# Patient Record
Sex: Female | Born: 1965 | Race: White | Hispanic: No | Marital: Married | Smoking: Never smoker
Health system: Southern US, Community
[De-identification: ages and names within clinical notes are randomized; demographics above are authoritative.]

## PROBLEM LIST (undated history)

## (undated) DIAGNOSIS — D649 Anemia, unspecified: Secondary | ICD-10-CM

## (undated) DIAGNOSIS — G43019 Migraine without aura, intractable, without status migrainosus: Secondary | ICD-10-CM

## (undated) HISTORY — DX: Migraine without aura, intractable, without status migrainosus: G43.019

## (undated) HISTORY — PX: BOTOX INJECTION: SHX5754

---

## 2001-11-17 ENCOUNTER — Other Ambulatory Visit: Admission: RE | Admit: 2001-11-17 | Discharge: 2001-11-17 | Payer: Self-pay | Admitting: Obstetrics and Gynecology

## 2001-11-21 ENCOUNTER — Encounter: Admission: RE | Admit: 2001-11-21 | Discharge: 2001-11-21 | Payer: Self-pay | Admitting: Obstetrics and Gynecology

## 2001-11-21 ENCOUNTER — Encounter: Payer: Self-pay | Admitting: Obstetrics and Gynecology

## 2005-09-29 ENCOUNTER — Ambulatory Visit: Payer: Self-pay | Admitting: Family Medicine

## 2005-10-20 ENCOUNTER — Ambulatory Visit: Payer: Self-pay | Admitting: Family Medicine

## 2005-12-22 ENCOUNTER — Ambulatory Visit: Payer: Self-pay | Admitting: Family Medicine

## 2006-04-20 ENCOUNTER — Ambulatory Visit: Payer: Self-pay | Admitting: Family Medicine

## 2011-02-02 ENCOUNTER — Emergency Department: Payer: Self-pay | Admitting: Emergency Medicine

## 2012-04-22 ENCOUNTER — Emergency Department: Payer: Self-pay | Admitting: Emergency Medicine

## 2012-05-03 ENCOUNTER — Ambulatory Visit: Payer: Self-pay | Admitting: Family Medicine

## 2012-05-06 HISTORY — PX: CERVICAL FUSION: SHX112

## 2012-07-11 ENCOUNTER — Other Ambulatory Visit: Payer: Self-pay | Admitting: Neurology

## 2012-09-12 ENCOUNTER — Telehealth: Payer: Self-pay | Admitting: Neurology

## 2012-09-12 ENCOUNTER — Ambulatory Visit: Payer: Self-pay | Admitting: Neurology

## 2012-09-12 NOTE — Telephone Encounter (Signed)
Mindy Jenkins please call patient and schedule appointment with Dr.Willis. Patient does not want Botox anymore and has not seen Willis since 2011.

## 2012-09-13 ENCOUNTER — Other Ambulatory Visit: Payer: Self-pay | Admitting: Neurology

## 2012-09-15 ENCOUNTER — Encounter: Payer: Self-pay | Admitting: Neurology

## 2012-09-15 ENCOUNTER — Ambulatory Visit (INDEPENDENT_AMBULATORY_CARE_PROVIDER_SITE_OTHER): Payer: Managed Care, Other (non HMO) | Admitting: Neurology

## 2012-09-15 VITALS — BP 138/85 | HR 102 | Wt 188.0 lb

## 2012-09-15 DIAGNOSIS — Z981 Arthrodesis status: Secondary | ICD-10-CM | POA: Insufficient documentation

## 2012-09-15 DIAGNOSIS — G43019 Migraine without aura, intractable, without status migrainosus: Secondary | ICD-10-CM

## 2012-09-15 MED ORDER — ZONISAMIDE 50 MG PO CAPS: ORAL_CAPSULE | ORAL | Status: DC

## 2012-09-15 MED ORDER — HYDROCODONE-ACETAMINOPHEN 10-325 MG PO TABS: 1.0000 | ORAL_TABLET | Freq: Four times a day (QID) | ORAL | Status: DC | PRN

## 2012-09-15 NOTE — Progress Notes (Signed)
Reason for visit: Headache  Mindy Jenkins is an 47 y.o. female  History of present illness:  Mindy Jenkins is a 47 year old right-handed white female with a history of migraine headaches. The patient indicates that she is starting to have an irregular menstrual cycle, going into menopause. With this, her headaches have significantly worsened, particularly over the last 2 or 3 months. The patient has had 16 days of the month with headache over the last 4 weeks. The patient indicates that the headaches are mainly in the front of the head. In the past, the patient has been on a multitude of medications prophylactically for her headache including Topamax, which did help her headache, but caused cognitive side effects. The patient has been on calcium channel blockers, beta blockers, Depakote, and tricyclic antidepressant medications without benefit. The patient was given a trial on Botox, and this seemed to help initially, but the patient indicates that ultimately, the benefit was modest, and the cost of the treatment did not justify the benefit. The patient had a lot of nausea with her headaches. The headaches may last up to 3 days at a time. The patient takes Maxalt and occasionally Imitrex injections for the headache. The patient has hydrocodone and Phenergan. The patient returns for an evaluation.  Past Medical History  Diagnosis Date  . Migraine without aura, with intractable migraine, so stated, without mention of status migrainosus     Past Surgical History  Procedure Laterality Date  . Back surgery  2014    Family History  Problem Relation Age of Onset  . Heart attack Mother   . Cervical cancer Mother   . Epilepsy Child     Rolandic    Social history:  reports that she has never smoked. She has never used smokeless tobacco. She reports that she does not drink alcohol or use illicit drugs.   No Known Allergies  Medications:  Current Outpatient Prescriptions on File Prior  to Visit  Medication Sig Dispense Refill  . MAGNESIUM-OXIDE 400 (241.3 MG) MG tablet TAKE 1 TABLET BY MOUTH EVERY DAY  90 tablet  0  . PROMETHEGAN 50 MG suppository INSERT 1 SUPPOSITORY RECTALLY EVERY 6 HOURS AS NEEDED  12 suppository  0  . rizatriptan (MAXALT) 10 MG tablet TAKE 1 TABLET BY MOUTH THREE TIMES DAILY AS NEEDED. MAXIMUM OF 3 TABLETS PER 24 HOURS  12 tablet  0   No current facility-administered medications on file prior to visit.    ROS:  Out of a complete 14 system review of symptoms, the patient complains only of the following symptoms, and all other reviewed systems are negative.  Headache Depression Insomnia, decreased energy  Blood pressure 138/85, pulse 102, weight 188 lb (85.276 kg).  Physical Exam  General: The patient is alert and cooperative at the time of the examination. The patient is minimally obese.  Skin: No significant peripheral edema is noted.   Neurologic Exam  Cranial nerves: Facial symmetry is present. Speech is normal, no aphasia or dysarthria is noted. Extraocular movements are full. Visual fields are full.  Motor: The patient has good strength in all 4 extremities.  Coordination: The patient has good finger-nose-finger and heel-to-shin bilaterally.  Gait and station: The patient has a normal gait. Tandem gait is normal. Romberg is negative. No drift is seen.  Reflexes: Deep tendon reflexes are symmetric.   Assessment/Plan:  One. Intractable migraine  The patient will be placed on Zonegran to see if this helps. If the patient gets no  benefit with the medication, or the medication cannot be tolerated, we may try long-acting topiramate, Trokendi. The patient will be continued on the Maxalt and Imitrex, and she will followup in 4 months.  Marlan Palau MD 09/15/2012 1:54 PM  [facility] [address] [place], [place] [address]  Phone [phone] Fax [phone]

## 2012-10-11 ENCOUNTER — Other Ambulatory Visit: Payer: Self-pay

## 2012-10-11 MED ORDER — HYDROCODONE-ACETAMINOPHEN 10-325 MG PO TABS: 1.0000 | ORAL_TABLET | Freq: Four times a day (QID) | ORAL | Status: DC | PRN

## 2012-10-11 NOTE — Telephone Encounter (Signed)
Patient called requesting a refill on Hydrocodone.  She would like the Rx mailed to her when it's ready.   

## 2012-11-16 ENCOUNTER — Telehealth: Payer: Self-pay | Admitting: Neurology

## 2012-11-16 ENCOUNTER — Other Ambulatory Visit: Payer: Self-pay

## 2012-11-16 MED ORDER — HYDROCODONE-ACETAMINOPHEN 10-325 MG PO TABS: 1.0000 | ORAL_TABLET | Freq: Four times a day (QID) | ORAL | Status: DC | PRN

## 2012-11-16 NOTE — Telephone Encounter (Signed)
Called patient about her prescription being ready to be mail, patient stated that she would like for her prescription to be left at the front desk for her to pick up, because she is going out of town. I stated to the patient that i would do that and if she has any questions or concerns to call us back.

## 2012-12-13 ENCOUNTER — Other Ambulatory Visit: Payer: Self-pay

## 2012-12-13 MED ORDER — ZONISAMIDE 50 MG PO CAPS: 100.0000 mg | ORAL_CAPSULE | Freq: Two times a day (BID) | ORAL | Status: DC

## 2012-12-13 MED ORDER — HYDROCODONE-ACETAMINOPHEN 10-325 MG PO TABS: 1.0000 | ORAL_TABLET | Freq: Four times a day (QID) | ORAL | Status: DC | PRN

## 2012-12-13 NOTE — Telephone Encounter (Signed)
Patient called requesting a refill on Zonegran for 50mg  two BID (since she has titrated up to this dose) and also wants a refill on Hydrocodone.  She would like to pick up the pain Rx when it's ready.  Call back number [phone].  Zonegran will be sent directly to her pharmacy.

## 2012-12-13 NOTE — Telephone Encounter (Signed)
I called patient to pick up Rx. For hydrocodone

## 2013-01-17 ENCOUNTER — Other Ambulatory Visit: Payer: Self-pay

## 2013-01-17 ENCOUNTER — Telehealth: Payer: Self-pay

## 2013-01-17 MED ORDER — HYDROCODONE-ACETAMINOPHEN 10-325 MG PO TABS: 1.0000 | ORAL_TABLET | Freq: Four times a day (QID) | ORAL | Status: DC | PRN

## 2013-01-17 MED ORDER — SUMATRIPTAN SUCCINATE 6 MG/0.5ML ~~LOC~~ SOLN: 6.0000 mg | Freq: Two times a day (BID) | SUBCUTANEOUS | Status: DC | PRN

## 2013-01-17 NOTE — Telephone Encounter (Signed)
I called patient about picking up her Rx (See Rx note). She states that she has had a migraine for five days and wants to know if she can get an injection. She states that otherwise, she will have to go to the ER. I let her know I will have to ask and will get back to her. I asked D. McCaw, RN if there was time. She stated that it would be okay if she could get here no later than 4 p.m. And it was okay with MD. I spoke with Dr Anne HahnWillis. He said that would be fine to give her 1 gm depacon.  I called patient back and left her a VM to call back. We can do this but she has to be here no later than 4 p.m.

## 2013-01-17 NOTE — Telephone Encounter (Signed)
Patient called requesting a refill on Norco.  She would like a call back at [phone] when the Rx is ready.

## 2013-01-17 NOTE — Telephone Encounter (Signed)
Patients father is coming from [place] and will not be here in time to bring her. Patient is requesting that we refill her imitrex instead of her coming in. I will forward information to Dr. Anne HahnWillis and Linton RumpJ. Banks, RPhT

## 2013-01-17 NOTE — Telephone Encounter (Signed)
Called patient. Rx ready for pick up. Her dad will pick up.

## 2013-01-17 NOTE — Telephone Encounter (Signed)
I have sent the Rx.  I called the patient.  She is aware.

## 2013-02-08 ENCOUNTER — Ambulatory Visit: Payer: Managed Care, Other (non HMO) | Admitting: Neurology

## 2013-03-01 ENCOUNTER — Other Ambulatory Visit: Payer: Self-pay | Admitting: *Deleted

## 2013-03-01 MED ORDER — HYDROCODONE-ACETAMINOPHEN 10-325 MG PO TABS: 1.0000 | ORAL_TABLET | Freq: Four times a day (QID) | ORAL | Status: DC | PRN

## 2013-03-01 MED ORDER — PROMETHAZINE HCL 50 MG RE SUPP: 50.0000 mg | Freq: Four times a day (QID) | RECTAL | Status: DC | PRN

## 2013-03-01 NOTE — Telephone Encounter (Signed)
Patient is requesting written Rx's.

## 2013-03-03 NOTE — Telephone Encounter (Signed)
Called patient and left message informing her that her Rx was ready to be picked up at the front desk and if she has any other problems, questions or concerns to call the office. °

## 2013-04-07 ENCOUNTER — Other Ambulatory Visit: Payer: Self-pay | Admitting: Neurology

## 2013-04-07 MED ORDER — HYDROCODONE-ACETAMINOPHEN 10-325 MG PO TABS: 1.0000 | ORAL_TABLET | Freq: Four times a day (QID) | ORAL | Status: DC | PRN

## 2013-04-07 NOTE — Telephone Encounter (Signed)
Patient requesting paper script refill of Norco. Please call and advise patient.

## 2013-04-07 NOTE — Telephone Encounter (Signed)
Called pt and left message informing her that her Rx was ready to be picked up at the front desk and if she has any other problems, questions or concerns to call the office.  °

## 2013-05-16 ENCOUNTER — Other Ambulatory Visit: Payer: Self-pay | Admitting: Neurology

## 2013-05-16 MED ORDER — HYDROCODONE-ACETAMINOPHEN 10-325 MG PO TABS: 1.0000 | ORAL_TABLET | Freq: Four times a day (QID) | ORAL | Status: DC | PRN

## 2013-05-16 NOTE — Telephone Encounter (Signed)
Request forwarded to provider for approval  

## 2013-05-16 NOTE — Telephone Encounter (Signed)
Pt called to request written refill on HYDROcodone-acetaminophen (NORCO) 10-325 MG per tablet, please call pt back when ready for pick up. Thanks

## 2013-05-17 NOTE — Telephone Encounter (Signed)
Called pt and left message informing her that her Rx was ready to be picked up at the front desk and if she has any other problems, questions or concerns to call the office.  °

## 2013-05-23 ENCOUNTER — Encounter (INDEPENDENT_AMBULATORY_CARE_PROVIDER_SITE_OTHER): Payer: Self-pay

## 2013-05-23 ENCOUNTER — Encounter: Payer: Self-pay | Admitting: Neurology

## 2013-05-23 ENCOUNTER — Ambulatory Visit (INDEPENDENT_AMBULATORY_CARE_PROVIDER_SITE_OTHER): Payer: Managed Care, Other (non HMO) | Admitting: Neurology

## 2013-05-23 VITALS — BP 132/81 | HR 91 | Wt 170.0 lb

## 2013-05-23 DIAGNOSIS — G43019 Migraine without aura, intractable, without status migrainosus: Secondary | ICD-10-CM

## 2013-05-23 MED ORDER — PROMETHAZINE HCL 50 MG PO TABS
50.0000 mg | ORAL_TABLET | Freq: Four times a day (QID) | ORAL | Status: DC | PRN
Start: 2013-05-23 — End: 2016-03-20

## 2013-05-23 MED ORDER — TOPIRAMATE ER 25 MG PO CAP24: ORAL_CAPSULE | ORAL | Status: DC

## 2013-05-23 NOTE — Progress Notes (Signed)
Reason for visit: Migraine  Mindy Jenkins is an 48 y.o. female  History of present illness:  Mindy Jenkins is a 48 year old right-handed white female with a history of intractable migraine headache. She indicates that she currently is getting 4 to 5 headaches a week. She indicates that she is having some hormonal instability associated with early menopause. She also indicates that she has been on her some increased stress as her husband has recently been diagnosed with multiple sclerosis. She has undergone multiple therapies for her migraine without definite benefit including Botox injections. Topamax seemed to helped her headaches previously, but she had significant cognitive side effects on the medication. The patient indicates that she got no benefit from Zonegran, and she discontinued this medication. She returns to this office for further evaluation.  Past Medical History  Diagnosis Date  . Migraine without aura, with intractable migraine, so stated, without mention of status migrainosus     Past Surgical History  Procedure Laterality Date  . Back surgery  2014    Family History  Problem Relation Age of Onset  . Heart attack Mother   . Cervical cancer Mother   . Epilepsy Child     Rolandic    Social history:  reports that she has never smoked. She has never used smokeless tobacco. She reports that she does not drink alcohol or use illicit drugs.   No Known Allergies  Medications:  Current Outpatient Prescriptions on File Prior to Visit  Medication Sig Dispense Refill  . HYDROcodone-acetaminophen (NORCO) 10-325 MG per tablet Take 1 tablet by mouth every 6 (six) hours as needed (Must last 28 days).  40 tablet  0  . MAGNESIUM-OXIDE 400 (241.3 MG) MG tablet TAKE 1 TABLET BY MOUTH EVERY DAY  90 tablet  0  . POTASSIUM GLUCONATE PO Take 1 tablet by mouth daily.      . promethazine (PROMETHEGAN) 50 MG suppository Place 1 suppository (50 mg total) rectally every 6 (six)  hours as needed for vomiting.  12 suppository  1  . rizatriptan (MAXALT) 10 MG tablet TAKE 1 TABLET BY MOUTH THREE TIMES DAILY AS NEEDED. MAXIMUM OF 3 TABLETS PER 24 HOURS  12 tablet  0  . SUMAtriptan (IMITREX) 6 MG/0.5ML SOLN injection Inject 0.5 mLs (6 mg total) into the skin 2 (two) times daily as needed for migraine or headache. F  6 vial  3   No current facility-administered medications on file prior to visit.    ROS:  Out of a complete 14 system review of symptoms, the patient complains only of the following symptoms, and all other reviewed systems are negative.  Frequent waking Headache  Blood pressure 132/81, pulse 91, weight 170 lb (77.111 kg).  Physical Exam  General: The patient is alert and cooperative at the time of the examination. The patient is minimally obese.  Skin: No significant peripheral edema is noted.   Neurologic Exam  Mental status: The patient is oriented x 3.  Cranial nerves: Facial symmetry is present. Speech is normal, no aphasia or dysarthria is noted. Extraocular movements are full. Visual fields are full.  Motor: The patient has good strength in all 4 extremities.  Sensory examination: Soft touch sensation is symmetric on the face, arms, and legs.  Coordination: The patient has good finger-nose-finger and heel-to-shin bilaterally.  Gait and station: The patient has a normal gait. Tandem gait is normal. Romberg is negative. No drift is seen.  Reflexes: Deep tendon reflexes are symmetric.  Assessment/Plan:  One. Migraine headache  The patient will be switched back to Trokendi, and we will start in low dose. The patient is to contact our office if she is tolerating it, but needs a higher dose. A prescription was also given for the Phenergan tablets. The patient will occasionally take Phenergan suppositories as well. She has Imitrex injections to take and Maxalt tablets. She will followup in 6 months.  Marlan Palau. Keith Willis MD 05/23/2013 9:42  PM  [facility] [address] [place], [place] [address]  Phone [phone] Fax [phone]

## 2013-05-23 NOTE — Patient Instructions (Signed)
Migraine Headache A migraine headache is an intense, throbbing pain on one or both sides of your head. A migraine can last for 30 minutes to several hours. CAUSES  The exact cause of a migraine headache is not always known. However, a migraine may be caused when nerves in the brain become irritated and release chemicals that cause inflammation. This causes pain. Certain things may also trigger migraines, such as:  Alcohol.  Smoking.  Stress.  Menstruation.  Aged cheeses.  Foods or drinks that contain nitrates, glutamate, aspartame, or tyramine.  Lack of sleep.  Chocolate.  Caffeine.  Hunger.  Physical exertion.  Fatigue.  Medicines used to treat chest pain (nitroglycerine), birth control pills, estrogen, and some blood pressure medicines. SIGNS AND SYMPTOMS  Pain on one or both sides of your head.  Pulsating or throbbing pain.  Severe pain that prevents daily activities.  Pain that is aggravated by any physical activity.  Nausea, vomiting, or both.  Dizziness.  Pain with exposure to bright lights, loud noises, or activity.  General sensitivity to bright lights, loud noises, or smells. Before you get a migraine, you may get warning signs that a migraine is coming (aura). An aura may include:  Seeing flashing lights.  Seeing bright spots, halos, or zig-zag lines.  Having tunnel vision or blurred vision.  Having feelings of numbness or tingling.  Having trouble talking.  Having muscle weakness. DIAGNOSIS  A migraine headache is often diagnosed based on:  Symptoms.  Physical exam.  A CT scan or MRI of your head. These imaging tests cannot diagnose migraines, but they can help rule out other causes of headaches. TREATMENT Medicines may be given for pain and nausea. Medicines can also be given to help prevent recurrent migraines.  HOME CARE INSTRUCTIONS  Only take over-the-counter or prescription medicines for pain or discomfort as directed by your  health care provider. The use of long-term narcotics is not recommended.  Lie down in a dark, quiet room when you have a migraine.  Keep a journal to find out what may trigger your migraine headaches. For example, write down:  What you eat and drink.  How much sleep you get.  Any change to your diet or medicines.  Limit alcohol consumption.  Quit smoking if you smoke.  Get 7 9 hours of sleep, or as recommended by your health care provider.  Limit stress.  Keep lights dim if bright lights bother you and make your migraines worse. SEEK IMMEDIATE MEDICAL CARE IF:   Your migraine becomes severe.  You have a fever.  You have a stiff neck.  You have vision loss.  You have muscular weakness or loss of muscle control.  You start losing your balance or have trouble walking.  You feel faint or pass out.  You have severe symptoms that are different from your first symptoms. MAKE SURE YOU:   Understand these instructions.  Will watch your condition.  Will get help right away if you are not doing well or get worse. Document Released: 12/22/2004 Document Revised: 10/12/2012 Document Reviewed: 08/29/2012 [employer] Patient Information 2014 [employer].  

## 2013-06-20 ENCOUNTER — Other Ambulatory Visit: Payer: Self-pay | Admitting: Neurology

## 2013-06-20 MED ORDER — HYDROCODONE-ACETAMINOPHEN 10-325 MG PO TABS: 1.0000 | ORAL_TABLET | Freq: Four times a day (QID) | ORAL | Status: DC | PRN

## 2013-06-20 NOTE — Telephone Encounter (Signed)
Request forwarded to provider for approval  

## 2013-06-20 NOTE — Telephone Encounter (Signed)
Patient requesting paper script refill of Norco to pick up.

## 2013-06-20 NOTE — Telephone Encounter (Signed)
Called pt to inform her that her Rx was ready to be picked up at the front desk and if she has any other problems, questions or concerns to call the office. Pt verbalized understanding. °

## 2013-07-25 ENCOUNTER — Ambulatory Visit: Payer: Self-pay | Admitting: Family Medicine

## 2013-07-26 ENCOUNTER — Other Ambulatory Visit: Payer: Self-pay | Admitting: Neurology

## 2013-07-26 MED ORDER — HYDROCODONE-ACETAMINOPHEN 10-325 MG PO TABS: 1.0000 | ORAL_TABLET | Freq: Four times a day (QID) | ORAL | Status: DC | PRN

## 2013-07-26 NOTE — Telephone Encounter (Signed)
Patient requesting Norco 10/325 script refill to pick up, states that she is having Perlie GoldRussell come and pick it up for her.

## 2013-07-26 NOTE — Telephone Encounter (Signed)
Request forwarded to provider for approval  

## 2013-07-26 NOTE — Telephone Encounter (Signed)
Called pt and spoke with pt's husband Perlie GoldRussell informing him that the pt's Rx was ready to be picked up at the front desk and if the pt has any other problems, questions or concerns to call the office. Husband verbalized understanding.

## 2013-08-28 ENCOUNTER — Telehealth: Payer: Self-pay | Admitting: *Deleted

## 2013-08-28 MED ORDER — TOPIRAMATE ER 25 MG PO CAP24
ORAL_CAPSULE | ORAL | Status: DC
Start: 2013-08-28 — End: 2013-11-28

## 2013-08-28 MED ORDER — HYDROCODONE-ACETAMINOPHEN 10-325 MG PO TABS: 1.0000 | ORAL_TABLET | Freq: Four times a day (QID) | ORAL | Status: DC | PRN

## 2013-08-28 NOTE — Telephone Encounter (Signed)
Please refill Norco 10-325. Patient would also like to discuss restarting botox.

## 2013-08-28 NOTE — Telephone Encounter (Signed)
Refill request forwarded to provider for approval.

## 2013-08-28 NOTE — Telephone Encounter (Signed)
I called patient. The patient will get a refill of her Norco, and she wants to retry the Botox injections. I'll try to get this set up. We will increase the Trokendi taking 25 mg in the morning, 50 mg in the evening.

## 2013-08-29 ENCOUNTER — Telehealth: Payer: Self-pay | Admitting: *Deleted

## 2013-08-29 NOTE — Telephone Encounter (Signed)
Patient needs to come by office to sign consent for botox.

## 2013-08-29 NOTE — Telephone Encounter (Signed)
Called pt to inform her that her Rx was ready to be picked up at the front desk and if she has any other problems, questions or concerns to call the office. Pt verbalized understanding. °

## 2013-09-27 ENCOUNTER — Telehealth: Payer: Self-pay | Admitting: Neurology

## 2013-09-27 NOTE — Telephone Encounter (Signed)
Patient requesting refill of generic of norco, please call when ready for pick up.

## 2013-09-29 MED ORDER — HYDROCODONE-ACETAMINOPHEN 10-325 MG PO TABS: 1.0000 | ORAL_TABLET | Freq: Four times a day (QID) | ORAL | Status: DC | PRN

## 2013-09-29 NOTE — Telephone Encounter (Signed)
Please advise previous note. Thanks  °

## 2013-09-29 NOTE — Telephone Encounter (Signed)
I will refill the hydrocodone. 

## 2013-10-03 NOTE — Telephone Encounter (Signed)
Called pts cell #.  LMVM for her that prescription ready for pick up.  (placed up front).

## 2013-10-31 ENCOUNTER — Other Ambulatory Visit: Payer: Self-pay | Admitting: Neurology

## 2013-10-31 MED ORDER — HYDROCODONE-ACETAMINOPHEN 10-325 MG PO TABS: 1.0000 | ORAL_TABLET | Freq: Four times a day (QID) | ORAL | Status: DC | PRN

## 2013-10-31 NOTE — Telephone Encounter (Signed)
Patient calling for Rx refill of HYDROcodone-acetaminophen (NORCO) 10-325 MG per tablet.  Please call when ready for pick up.

## 2013-10-31 NOTE — Telephone Encounter (Signed)
Request entered, forwarded to provider for approval.  

## 2013-10-31 NOTE — Telephone Encounter (Signed)
I called the patient and left a message to let them know their Rx for Hydrocodone was ready for pickup. Patient was instructed to bring Photo ID.

## 2013-11-28 ENCOUNTER — Encounter: Payer: Self-pay | Admitting: Adult Health

## 2013-11-28 ENCOUNTER — Ambulatory Visit (INDEPENDENT_AMBULATORY_CARE_PROVIDER_SITE_OTHER): Payer: Managed Care, Other (non HMO) | Admitting: Adult Health

## 2013-11-28 VITALS — BP 126/76 | HR 74 | Temp 97.4°F | Ht 65.0 in | Wt 182.0 lb

## 2013-11-28 DIAGNOSIS — G43019 Migraine without aura, intractable, without status migrainosus: Secondary | ICD-10-CM

## 2013-11-28 MED ORDER — RIZATRIPTAN BENZOATE 10 MG PO TABS: ORAL_TABLET | ORAL | Status: DC

## 2013-11-28 MED ORDER — HYDROCODONE-ACETAMINOPHEN 10-325 MG PO TABS: 1.0000 | ORAL_TABLET | Freq: Four times a day (QID) | ORAL | Status: DC | PRN

## 2013-11-28 MED ORDER — MAGNESIUM OXIDE -MG SUPPLEMENT 200 MG PO TABS: 200.0000 mg | ORAL_TABLET | Freq: Every day | ORAL | Status: DC

## 2013-11-28 NOTE — Patient Instructions (Signed)

## 2013-11-28 NOTE — Progress Notes (Signed)
I have read the note, and I agree with the clinical assessment and plan.  Kaaren Nass KEITH   

## 2013-11-28 NOTE — Progress Notes (Signed)
PATIENT: Mindy Jenkins DOB: 08-12-1965  REASON FOR VISIT: follow up HISTORY FROM: patient  HISTORY OF PRESENT ILLNESS: Ms. Mindy Jenkins is a 48 year old female with a history of intractable migraines. She returns today for follow-up. She was prescribed Trokendi 25 mg in the AM and 50 mg at PM but stopped it due to word finding issues. She states that she started working full time and can't be "cloudy" headed. She reports that her migraines have gotten worse. She has approximately 2 migraines a week. She also takes maxalt and imitrex injections if needed for the migraine. She also has Norco for severe discomfort. She is going to try botox injections again. She as an appointment scheduled for January 6th. The patient has tried multiple medications in the past without benefit. She believes that her hormones play a factor in her migraines as well as stress. The patient is currently under increased stress due to her husband being diagnosed with multiple sclerosis.  HISTORY 05/23/13 (WILLIS): Ms. Mindy Jenkins is a 48 year old right-handed white female with a history of intractable migraine headache. She indicates that she currently is getting 4 to 5 headaches a week. She indicates that she is having some hormonal instability associated with early menopause. She also indicates that she has been on her some increased stress as her husband has recently been diagnosed with multiple sclerosis. She has undergone multiple therapies for her migraine without definite benefit including Botox injections. Topamax seemed to helped her headaches previously, but she had significant cognitive side effects on the medication. The patient indicates that she got no benefit from Zonegran, and she discontinued this medication. She returns to this office for further evaluation.  REVIEW OF SYSTEMS: Out of a complete 14 system review of symptoms, the patient complains only of the following symptoms, and all other reviewed systems  are negative.  Headache Environmental allergies  ALLERGIES: No Known Allergies  HOME MEDICATIONS: Outpatient Prescriptions Prior to Visit  Medication Sig Dispense Refill  . buPROPion (WELLBUTRIN SR) 150 MG 12 hr tablet Take 150 mg by mouth daily.    Marland Kitchen. HYDROcodone-acetaminophen (NORCO) 10-325 MG per tablet Take 1 tablet by mouth every 6 (six) hours as needed (Must last 28 days). 40 tablet 0  . MAGNESIUM-OXIDE 400 (241.3 MG) MG tablet TAKE 1 TABLET BY MOUTH EVERY DAY 90 tablet 0  . NASONEX 50 MCG/ACT nasal spray Place 50 g into the nose daily.    Marland Kitchen. POTASSIUM GLUCONATE PO Take 1 tablet by mouth daily.    . promethazine (PHENERGAN) 50 MG tablet Take 1 tablet (50 mg total) by mouth every 6 (six) hours as needed for nausea or vomiting. 30 tablet 3  . promethazine (PROMETHEGAN) 50 MG suppository Place 1 suppository (50 mg total) rectally every 6 (six) hours as needed for vomiting. 12 suppository 1  . rizatriptan (MAXALT) 10 MG tablet TAKE 1 TABLET BY MOUTH THREE TIMES DAILY AS NEEDED. MAXIMUM OF 3 TABLETS PER 24 HOURS 12 tablet 0  . SUMAtriptan (IMITREX) 6 MG/0.5ML SOLN injection Inject 0.5 mLs (6 mg total) into the skin 2 (two) times daily as needed for migraine or headache. F 6 vial 3  . Topiramate ER 25 MG CP24 1 tablet in the morning, 2 tablets at night 90 capsule 3   No facility-administered medications prior to visit.    PAST MEDICAL HISTORY: Past Medical History  Diagnosis Date  . Migraine without aura, with intractable migraine, so stated, without mention of status migrainosus  PAST SURGICAL HISTORY: Past Surgical History  Procedure Laterality Date  . Back surgery  2014    FAMILY HISTORY: Family History  Problem Relation Age of Onset  . Heart attack Mother   . Cervical cancer Mother   . Epilepsy Child     Rolandic    SOCIAL HISTORY: History   Social History  . Marital Status: Married    Spouse Name: N/A    Number of Children: 2  . Years of Education: N/A    Occupational History  . self employed     Licensed conveyancersecret shopper   Social History Main Topics  . Smoking status: Never Smoker   . Smokeless tobacco: Never Used  . Alcohol Use: No  . Drug Use: No  . Sexual Activity: Not on file   Other Topics Concern  . Not on file   Social History Narrative      PHYSICAL EXAM  Filed Vitals:   11/28/13 1334  BP: 126/76  Pulse: 74  Temp: 97.4 F (36.3 C)  TempSrc: Oral  Height: 5\' 5"  (1.651 m)  Weight: 182 lb (82.555 kg)   Body mass index is 30.29 kg/(m^2).  Generalized: Well developed, in no acute distress   Neurological examination  Mentation: Alert oriented to time, place, history taking. Follows all commands speech and language fluent Cranial nerve II-XII: Pupils were equal round reactive to light. Extraocular movements were full, visual field were full on confrontational test. Facial sensation and strength were normal. Uvula tongue midline. Head turning and shoulder shrug  were normal and symmetric. Motor: The motor testing reveals 5 over 5 strength of all 4 extremities. Good symmetric motor tone is noted throughout.  Sensory: Sensory testing is intact to soft touch on all 4 extremities. No evidence of extinction is noted.  Coordination: Cerebellar testing reveals good finger-nose-finger and heel-to-shin bilaterally.  Gait and station: Gait is normal. Tandem gait is normal. Romberg is negative. No drift is seen.  Reflexes: Deep tendon reflexes are symmetric and normal bilaterally.   DIAGNOSTIC DATA (LABS, IMAGING, TESTING) - I reviewed patient records, labs, notes, testing and imaging myself where available.   ASSESSMENT AND PLAN 48 y.o. year old female  has a past medical history of Migraine without aura, with intractable migraine, so stated, without mention of status migrainosus. here with:  1. Migraines  The patient continues to have approximately 2 migraines a week. She has tried multiple medications in the past without  benefit. She is currently using Maxalt, Imitrex injections and Norco. I will refill these medications today. Since the patient is on narcotics, I had the patient sign a narcotic agreement. The patient will receive Botox injections January 6. At this time I will not start any new medications. I have advised patient that since she has noticed that her headaches are triggered by hormones she may benefit from birth control pills. I explained that sometimes this can make migraines worse but in some cases it does offer benefit. The patient states that she will speak with her OB/GYN regarding this. If the patient's symptoms worsen or she develops new symptoms she should let us know. Otherwise she will follow-up in 3-4 months.  Butch PennyMegan Raghad Lorenz, MSN, NP-C 11/28/2013, 1:42 PM [facility] [address], [address] [place], [place] [address] [phone]  Note: This document was prepared with digital dictation and possible smart phrase technology. Any transcriptional errors that result from this process are unintentional.

## 2013-12-25 ENCOUNTER — Other Ambulatory Visit: Payer: Self-pay | Admitting: Neurology

## 2013-12-25 NOTE — Telephone Encounter (Signed)
Dr Willis is out of the office.  Request entered, forwarded to WID for approval.  

## 2013-12-25 NOTE — Telephone Encounter (Signed)
Patient is calling to get a written Rx for generic Hydrocodone. Please call patient when ready for pickup. Thank you.

## 2013-12-26 MED ORDER — HYDROCODONE-ACETAMINOPHEN 10-325 MG PO TABS: 1.0000 | ORAL_TABLET | Freq: Four times a day (QID) | ORAL | Status: DC | PRN

## 2013-12-26 NOTE — Telephone Encounter (Signed)
Spoke to patient's husband that Norco ready for pick up.

## 2014-01-10 ENCOUNTER — Ambulatory Visit (INDEPENDENT_AMBULATORY_CARE_PROVIDER_SITE_OTHER): Payer: Managed Care, Other (non HMO) | Admitting: Neurology

## 2014-01-10 DIAGNOSIS — G43019 Migraine without aura, intractable, without status migrainosus: Secondary | ICD-10-CM

## 2014-01-10 DIAGNOSIS — G43719 Chronic migraine without aura, intractable, without status migrainosus: Secondary | ICD-10-CM

## 2014-01-10 MED ORDER — ONABOTULINUMTOXINA 100 UNITS IJ SOLR
200.0000 [IU] | Freq: Once | INTRAMUSCULAR | Status: AC
  Administered 2014-01-10: 200 [IU] via INTRAMUSCULAR

## 2014-01-10 MED ORDER — SUMATRIPTAN SUCCINATE 6.5 MG/4HR TD PTCH: 6.5000 | MEDICATED_PATCH | TRANSDERMAL | Status: DC | PRN

## 2014-01-10 NOTE — Progress Notes (Signed)
PATIENT: Mindy Jenkins DOB: [birth year]  HISTORY OF PRESENT ILLNESS: Mindy Jenkins is a 49 year old female with a history of intractable migraines.   I have tried BOTOX injection in the past, 2013,  without significant benefit,   In early 2015, her migraine was much improved, she has gone through menopause, since Summer 2015, she began to have frequent headaches ache.  She has about 2-3 times a week, her typical migraine are right frontal area severe pounding headaches with associated light noise sensitivity, nauseous, lasting for few hours, responding to Maxalt, and Imitrex subcutaneous injection as needed.  Previously she has tried Topamax, Trokendi complains side effect of cognitive slowing, word finding difficulties, it does help her headaches. She also tried nortriptyline, Inderal, many years ago, without having her migraine under control.  HISTORY: Mindy Jenkins is a 49 year old right-handed white female with a history of intractable migraine headache over past 16 years since 2000. She indicates that she currently is getting 4 to 5 headaches a week. She indicates that she is having some hormonal instability associated with early menopause. She also indicates that she has been on her some increased stress as her husband has recently been diagnosed with multiple sclerosis. She has undergone multiple therapies for her migraine without definite benefit including Botox injections in 2013. Topamax, Trokeni seemed to helped her headaches previously, but she had significant cognitive side effects on the medication. The patient indicates that she got no benefit from Zonegran, and she discontinued this medication. She returns to this office for further evaluation.  REVIEW OF SYSTEMS: Out of a complete 14 system review of symptoms, the patient complains only of the following symptoms, and all other reviewed systems are negative. Headache Environmental allergies  ALLERGIES: No Known  Allergies  HOME MEDICATIONS: Outpatient Prescriptions Prior to Visit  Medication Sig Dispense Refill  . HYDROcodone-acetaminophen (NORCO) 10-325 MG per tablet Take 1 tablet by mouth every 6 (six) hours as needed (Must last 28 days). 40 tablet 0  . Magnesium Oxide 200 MG TABS Take 1 tablet (200 mg total) by mouth daily. (Patient taking differently: Take 200 mg by mouth once a week. ) 30 tablet 6  . NASONEX 50 MCG/ACT nasal spray Place 50 g into the nose daily.    . promethazine (PHENERGAN) 50 MG tablet Take 1 tablet (50 mg total) by mouth every 6 (six) hours as needed for nausea or vomiting. 30 tablet 3  . promethazine (PROMETHEGAN) 50 MG suppository Place 1 suppository (50 mg total) rectally every 6 (six) hours as needed for vomiting. 12 suppository 1  . rizatriptan (MAXALT) 10 MG tablet TAKE 1 TABLET BY MOUTH THREE TIMES DAILY AS NEEDED. MAXIMUM OF 3 TABLETS PER 24 HOURS 12 tablet 3  . SUMAtriptan (IMITREX) 6 MG/0.5ML SOLN injection Inject 0.5 mLs (6 mg total) into the skin 2 (two) times daily as needed for migraine or headache. F 6 vial 3  . buPROPion (WELLBUTRIN SR) 150 MG 12 hr tablet Take 100 mg by mouth daily.     Marland Kitchen POTASSIUM GLUCONATE PO Take 1 tablet by mouth daily.     No facility-administered medications prior to visit.    PAST MEDICAL HISTORY: Past Medical History  Diagnosis Date  . Migraine without aura, with intractable migraine, so stated, without mention of status migrainosus     PAST SURGICAL HISTORY: Past Surgical History  Procedure Laterality Date  . Back surgery  2014    FAMILY HISTORY: Family History  Problem Relation Age of  Onset  . Heart attack Mother   . Cervical cancer Mother   . Epilepsy Child     Rolandic    SOCIAL HISTORY: History   Social History  . Marital Status: Married    Spouse Name: N/A    Number of Children: 2  . Years of Education: N/A   Occupational History  . self employed     Licensed conveyancersecret shopper   Social History Main Topics  .  Smoking status: Never Smoker   . Smokeless tobacco: Never Used  . Alcohol Use: No  . Drug Use: No  . Sexual Activity: Not on file   Other Topics Concern  . Not on file   Social History Narrative      PHYSICAL EXAM  Filed Vitals:   Cannot calculate BMI with a height equal to zero.  Generalized: Well developed, in no acute distress   Neurological examination  Mentation: Alert oriented to time, place, history taking. Follows all commands speech and language fluent Cranial nerve II-XII: Pupils were equal round reactive to light. Extraocular movements were full, visual field were full on confrontational test. Facial sensation and strength were normal. Uvula tongue midline. Head turning and shoulder shrug  were normal and symmetric. Motor: The motor testing reveals 5 over 5 strength of all 4 extremities. Good symmetric motor tone is noted throughout.  Sensory: Sensory testing is intact to soft touch on all 4 extremities. No evidence of extinction is noted.  Coordination: Cerebellar testing reveals good finger-nose-finger and heel-to-shin bilaterally.  Gait and station: Gait is normal. Tandem gait is normal. Romberg is negative. No drift is seen.  Reflexes: Deep tendon reflexes are symmetric and normal bilaterally.   DIAGNOSTIC DATA (LABS, IMAGING, TESTING) - I reviewed patient records, labs, notes, testing and imaging myself where available.   ASSESSMENT AND PLAN 49 years old Caucasian female, with history of intractable chronic migraine headaches, tried and failed multiple preventative medications in the past, came in for Botox injection as migraine prevention.  BOTOX injection was performed according to protocol by [employer]er[employer]. 100 units of BOTOX was dissolved into 2 cc NS.   Total of 155 units, discard 45 units.  Corrugator 2 sites, 10 units Procerus 1 site, 5 unit Frontalis 4 sites,  20 units, Temporalis 8 sites,  40 units  Occipitalis 6 sites, 30 units Cervical Paraspinal, 4  sites, 20 units Trapezius, 6 sites, 30 units  Patient tolerate the injection well. Will return for repeat injection in 3 months.   Levert FeinsteinYijun Ross Bender, M.D. Ph.D [facility] [address], [address] [place], [place] [id number] [phone]

## 2014-01-30 ENCOUNTER — Other Ambulatory Visit: Payer: Self-pay | Admitting: Neurology

## 2014-01-30 MED ORDER — HYDROCODONE-ACETAMINOPHEN 10-325 MG PO TABS: 1.0000 | ORAL_TABLET | Freq: Four times a day (QID) | ORAL | Status: DC | PRN

## 2014-01-30 NOTE — Telephone Encounter (Signed)
Request entered, forwarded to provider for approval.  

## 2014-01-30 NOTE — Telephone Encounter (Signed)
[facility]  Please call.

## 2014-01-31 ENCOUNTER — Telehealth: Payer: Self-pay

## 2014-01-31 NOTE — Telephone Encounter (Signed)
Called patient to inform Rx ready for pick up at front desk. No answer. Left vmail. 

## 2014-02-27 ENCOUNTER — Telehealth: Payer: Self-pay

## 2014-02-27 ENCOUNTER — Other Ambulatory Visit: Payer: Self-pay | Admitting: Neurology

## 2014-02-27 MED ORDER — MAGNESIUM OXIDE -MG SUPPLEMENT 200 MG PO TABS: 200.0000 mg | ORAL_TABLET | Freq: Every day | ORAL | Status: DC

## 2014-02-27 MED ORDER — HYDROCODONE-ACETAMINOPHEN 10-325 MG PO TABS: 1.0000 | ORAL_TABLET | Freq: Four times a day (QID) | ORAL | Status: DC | PRN

## 2014-02-27 NOTE — Telephone Encounter (Signed)
Called patient and informed Rx ready for pick up at front desk. Spouse verbalized understanding.

## 2014-02-27 NOTE — Telephone Encounter (Signed)
Request entered, forwarded to provider for approval.  

## 2014-02-27 NOTE — Telephone Encounter (Signed)
Pt is calling requesting a written for HYDROcodone-acetaminophen (NORCO) 10-325 MG per tablet.  She wants to pick this up by noon on Thursday 03/01/14 because she is going out of town.  She also needs refill on Magnesium Oxide 200 MG TABS [facility] to [facility] [address] [place].  Please call and advise.

## 2014-03-28 ENCOUNTER — Ambulatory Visit: Payer: Self-pay | Admitting: Family Medicine

## 2014-04-02 ENCOUNTER — Telehealth: Payer: Self-pay

## 2014-04-02 ENCOUNTER — Other Ambulatory Visit: Payer: Self-pay | Admitting: Neurology

## 2014-04-02 MED ORDER — HYDROCODONE-ACETAMINOPHEN 10-325 MG PO TABS: 1.0000 | ORAL_TABLET | Freq: Four times a day (QID) | ORAL | Status: DC | PRN

## 2014-04-02 NOTE — Telephone Encounter (Signed)
I spoke with patient and advised her that her Norco Rx was ready to pick up at front desk.

## 2014-04-02 NOTE — Telephone Encounter (Signed)
Request entered, forwarded to provider for approval.  

## 2014-04-02 NOTE — Telephone Encounter (Signed)
Pt is calling requesting a written Rx for HYDROcodone-acetaminophen (NORCO) 10-325 MG per tablet. Please call when ready for pick up. °

## 2014-04-04 ENCOUNTER — Ambulatory Visit: Payer: Managed Care, Other (non HMO) | Admitting: Adult Health

## 2014-04-11 ENCOUNTER — Telehealth: Payer: Self-pay | Admitting: Adult Health

## 2014-04-11 NOTE — Telephone Encounter (Signed)
Molli HazardMatthew with [facility] @ [phone], questioning if patient has any drug allergies.  Please call and advise.

## 2014-04-11 NOTE — Telephone Encounter (Signed)
Called [facility] back and spoke with CraigPhillip. Molli HazardMatthew was not available and they had all information they needed on patient.

## 2014-04-18 ENCOUNTER — Ambulatory Visit (INDEPENDENT_AMBULATORY_CARE_PROVIDER_SITE_OTHER): Payer: Managed Care, Other (non HMO) | Admitting: Neurology

## 2014-04-18 ENCOUNTER — Encounter: Payer: Self-pay | Admitting: Neurology

## 2014-04-18 ENCOUNTER — Encounter: Payer: Self-pay | Admitting: *Deleted

## 2014-04-18 VITALS — BP 126/85 | HR 76 | Ht 65.0 in | Wt 176.0 lb

## 2014-04-18 DIAGNOSIS — G43719 Chronic migraine without aura, intractable, without status migrainosus: Secondary | ICD-10-CM | POA: Diagnosis not present

## 2014-04-18 MED ORDER — ONABOTULINUMTOXINA 100 UNITS IJ SOLR: 200.0000 [IU] | Freq: Once | INTRAMUSCULAR | Status: DC

## 2014-04-18 MED ORDER — RIBOFLAVIN 100 MG PO CAPS: 100.0000 mg | ORAL_CAPSULE | Freq: Two times a day (BID) | ORAL | Status: DC

## 2014-04-18 MED ORDER — RIZATRIPTAN BENZOATE 10 MG PO TABS: ORAL_TABLET | ORAL | Status: DC

## 2014-04-18 NOTE — Progress Notes (Signed)
PATIENT: Mindy Jenkins DOB: [birth year]  HISTORY OF PRESENT ILLNESS: [name] is a 49 year old female with a history of intractable migraines.   I have tried BOTOX injection irregularly in the past, 2013,  without significant benefit,   In early 2015, her migraine was much improved, she has gone through menopause, since Summer 2015, she began to have frequent headaches   She has migraine about 2-3 times a week, her typical migraine are right frontal area severe pounding headaches with associated light noise sensitivity, nauseous, lasting for few hours, responding to Maxalt, and Imitrex subcutaneous injection as needed.  Previously she has tried Topamax, Trokendi complains side effect of cognitive slowing, word finding difficulties, it does help her headaches. No help with Zonegran She also tried nortriptyline, Inderal, many years ago, without having her migraine under control.  UPDATE April 18 2014: She had her injection in Jan 2016, no significant improvement, she still has migraine headaches 2-3 times each week,  She is taking imitrex, phenergan, narco, as needed, Maxalt seems to work better, I have advised her at magnesium oxide 400 mg twice a day, along with riboflavin 100 mg twice a day, she wants to retry Maxalt dissolvable, which seems to help her headaches better, I also advised her to take Excedrin Migraine, when necessary NSAIDs,  REVIEW OF SYSTEMS: Out of a complete 14 system review of symptoms, the patient complains only of the following symptoms, and all other reviewed systems are negative. Headache Environmental allergies  ALLERGIES: No Known Allergies  HOME MEDICATIONS: Outpatient Prescriptions Prior to Visit  Medication Sig Dispense Refill  . Botulinum Toxin Type A 200 UNITS SOLR Inject 200 Units as directed.    Marland Kitchen. buPROPion (WELLBUTRIN) 100 MG tablet Take 100 mg by mouth every evening.     Marland Kitchen. HYDROcodone-acetaminophen (NORCO) 10-325 MG per tablet Take 1  tablet by mouth every 6 (six) hours as needed (Must last 28 days). 40 tablet 0  . Magnesium Oxide 200 MG TABS Take 1 tablet (200 mg total) by mouth daily. 30 tablet 6  . NASONEX 50 MCG/ACT nasal spray Place 50 g into the nose daily.    . promethazine (PHENERGAN) 50 MG tablet Take 1 tablet (50 mg total) by mouth every 6 (six) hours as needed for nausea or vomiting. 30 tablet 3  . promethazine (PROMETHEGAN) 50 MG suppository Place 1 suppository (50 mg total) rectally every 6 (six) hours as needed for vomiting. 12 suppository 1  . rizatriptan (MAXALT) 10 MG tablet TAKE 1 TABLET BY MOUTH THREE TIMES DAILY AS NEEDED. MAXIMUM OF 3 TABLETS PER 24 HOURS 12 tablet 3  . SUMAtriptan (IMITREX) 6 MG/0.5ML SOLN injection Inject 0.5 mLs (6 mg total) into the skin 2 (two) times daily as needed for migraine or headache. F 6 vial 3  . SUMAtriptan Succinate (ZECUITY) 6.5 MG/4HR PTCH Place 6.5 patches onto the skin as needed. 15 patch 6   No facility-administered medications prior to visit.    PAST MEDICAL HISTORY: Past Medical History  Diagnosis Date  . Migraine without aura, with intractable migraine, so stated, without mention of status migrainosus     PAST SURGICAL HISTORY: Past Surgical History  Procedure Laterality Date  . Back surgery  2014    FAMILY HISTORY: Family History  Problem Relation Age of Onset  . Heart attack Mother   . Cervical cancer Mother   . Epilepsy Child     Rolandic    SOCIAL HISTORY: History   Social History  .  Marital Status: Married    Spouse Name: N/A  . Number of Children: 2  . Years of Education: N/A   Occupational History  . self employed     Licensed conveyancer   Social History Main Topics  . Smoking status: Never Smoker   . Smokeless tobacco: Never Used  . Alcohol Use: No  . Drug Use: No  . Sexual Activity: Not on file   Other Topics Concern  . Not on file   Social History Narrative      PHYSICAL EXAM  Filed Vitals:   04/18/14 1536  BP:  126/85  Pulse: 76  Height:  (1.651 m)  Weight: 176 lb (79.833 kg)   Body mass index is 29.29 kg/(m^2).  Generalized: Well developed, in no acute distress   Neurological examination  Mentation: Alert oriented to time, place, history taking. Follows all commands speech and language fluent Cranial nerve II-XII: Pupils were equal round reactive to light. Extraocular movements were full, visual field were full on confrontational test. Facial sensation and strength were normal. Uvula tongue midline. Head turning and shoulder shrug  were normal and symmetric. Motor: The motor testing reveals 5 over 5 strength of all 4 extremities. Good symmetric motor tone is noted throughout.  Sensory: Sensory testing is intact to soft touch on all 4 extremities. No evidence of extinction is noted.  Coordination: Cerebellar testing reveals good finger-nose-finger and heel-to-shin bilaterally.  Gait and station: Gait is normal. Tandem gait is normal. Romberg is negative. No drift is seen.  Reflexes: Deep tendon reflexes are symmetric and normal bilaterally.   DIAGNOSTIC DATA (LABS, IMAGING, TESTING) - I reviewed patient records, labs, notes, testing and imaging myself where available.   ASSESSMENT AND PLAN 49 years old Caucasian female, with history of intractable chronic migraine headaches, tried and failed multiple preventative medications in the past, came in for Botox injection as migraine prevention.  BOTOX injection was performed according to protocol by [employer]. 100 units of BOTOX was dissolved into 2 cc NS.   Total of 200 units, she got BOTOX through [facility]  Corrugator 2 sites, 10 units Procerus 1 site, 5 unit Frontalis 4 sites,  20 units, Temporalis 8 sites,  40 units  Occipitalis 6 sites, 30 units Cervical Paraspinal, 4 sites, 20 units Trapezius, 6 sites, 30 units  45 units along bilateral cervical paraspinals  Patient tolerate the injection well. Will return for repeat  injection in 3 months.   Levert Feinstein, M.D. Ph.D [facility] [address], [address] [place], [place] [address] [phone]

## 2014-05-16 ENCOUNTER — Other Ambulatory Visit: Payer: Self-pay | Admitting: Neurology

## 2014-05-16 MED ORDER — HYDROCODONE-ACETAMINOPHEN 10-325 MG PO TABS: 1.0000 | ORAL_TABLET | Freq: Four times a day (QID) | ORAL | Status: DC | PRN

## 2014-05-16 NOTE — Telephone Encounter (Signed)
Patient called and requested a refill on Rx. HYDROcodone-acetaminophen (NORCO) 10-325 MG per tablet. Please call and advise.  °

## 2014-05-16 NOTE — Telephone Encounter (Signed)
Request entered, forwarded to provider for approval.  

## 2014-05-17 ENCOUNTER — Telehealth: Payer: Self-pay

## 2014-05-17 NOTE — Telephone Encounter (Signed)
Rx ready for pick up. 

## 2014-06-13 ENCOUNTER — Telehealth: Payer: Self-pay | Admitting: Obstetrics and Gynecology

## 2014-06-13 NOTE — Telephone Encounter (Signed)
Pt needs a cream bacterial inf. Mel said she could call and ask for it if she needed it again. [email or web address]

## 2014-06-13 NOTE — Telephone Encounter (Signed)
Please advise 

## 2014-06-14 ENCOUNTER — Other Ambulatory Visit: Payer: Self-pay | Admitting: Obstetrics and Gynecology

## 2014-06-14 DIAGNOSIS — B9689 Other specified bacterial agents as the cause of diseases classified elsewhere: Secondary | ICD-10-CM

## 2014-06-14 DIAGNOSIS — N760 Acute vaginitis: Principal | ICD-10-CM

## 2014-06-14 MED ORDER — METRONIDAZOLE 0.75 % VA GEL: 1.0000 | Freq: Every day | VAGINAL | Status: DC

## 2014-06-14 NOTE — Telephone Encounter (Signed)
Please call and clarify message, rx for metrogel sent in, also past due for AE

## 2014-06-19 ENCOUNTER — Telehealth: Payer: Self-pay | Admitting: Obstetrics and Gynecology

## 2014-06-19 NOTE — Telephone Encounter (Signed)
Spoke with [facility] and notified them that the patient might have accidentally called [place] about a referral. They'll notify Amy and get it processed.

## 2014-06-19 NOTE — Telephone Encounter (Signed)
Left message to return call 

## 2014-06-19 NOTE — Telephone Encounter (Signed)
Pt called left message at [facility] that she needs referral to PT.Marland Kitchen [facility] said they think Melody is referring her..please call patient

## 2014-06-20 ENCOUNTER — Encounter (INDEPENDENT_AMBULATORY_CARE_PROVIDER_SITE_OTHER): Payer: Self-pay

## 2014-06-20 ENCOUNTER — Encounter: Payer: Self-pay | Admitting: Family Medicine

## 2014-06-20 ENCOUNTER — Ambulatory Visit (INDEPENDENT_AMBULATORY_CARE_PROVIDER_SITE_OTHER): Payer: Managed Care, Other (non HMO) | Admitting: Family Medicine

## 2014-06-20 VITALS — BP 128/80 | HR 74 | Temp 98.4°F | Resp 16 | Ht 65.0 in | Wt 182.5 lb

## 2014-06-20 DIAGNOSIS — R5383 Other fatigue: Secondary | ICD-10-CM | POA: Diagnosis not present

## 2014-06-20 DIAGNOSIS — R1013 Epigastric pain: Secondary | ICD-10-CM

## 2014-06-20 DIAGNOSIS — J45909 Unspecified asthma, uncomplicated: Secondary | ICD-10-CM

## 2014-06-20 DIAGNOSIS — R1011 Right upper quadrant pain: Secondary | ICD-10-CM | POA: Diagnosis not present

## 2014-06-20 DIAGNOSIS — E66811 Obesity, class 1: Secondary | ICD-10-CM | POA: Insufficient documentation

## 2014-06-20 DIAGNOSIS — Z981 Arthrodesis status: Secondary | ICD-10-CM | POA: Diagnosis not present

## 2014-06-20 DIAGNOSIS — J0100 Acute maxillary sinusitis, unspecified: Secondary | ICD-10-CM | POA: Diagnosis not present

## 2014-06-20 DIAGNOSIS — M2550 Pain in unspecified joint: Secondary | ICD-10-CM | POA: Diagnosis not present

## 2014-06-20 DIAGNOSIS — R140 Abdominal distension (gaseous): Secondary | ICD-10-CM | POA: Diagnosis not present

## 2014-06-20 DIAGNOSIS — E669 Obesity, unspecified: Secondary | ICD-10-CM | POA: Insufficient documentation

## 2014-06-20 DIAGNOSIS — J309 Allergic rhinitis, unspecified: Secondary | ICD-10-CM

## 2014-06-20 DIAGNOSIS — R0982 Postnasal drip: Secondary | ICD-10-CM

## 2014-06-20 MED ORDER — AMOXICILLIN-POT CLAVULANATE 875-125 MG PO TABS: 1.0000 | ORAL_TABLET | Freq: Two times a day (BID) | ORAL | Status: DC

## 2014-06-20 MED ORDER — FLUTICASONE PROPIONATE 50 MCG/ACT NA SUSP: 2.0000 | Freq: Every day | NASAL | Status: DC

## 2014-06-20 MED ORDER — PREDNISONE 10 MG (21) PO TBPK: ORAL_TABLET | Freq: Every day | ORAL | Status: DC

## 2014-06-20 NOTE — Patient Instructions (Signed)

## 2014-06-20 NOTE — Progress Notes (Signed)
Name: Mindy Jenkins   MRN: [id number]    DOB: 1965-04-02   Date:06/20/2014       Progress Note  Subjective  Chief Complaint  Chief Complaint  Patient presents with  . Abdominal Pain    right upper quadrant pain for quite some time now    HPI  Abdominal Pain: Patient complains of abdominal pain. The pain is described as aching, colicky and sharp, and is 8/10 in intensity. Pain is located in the RUQ without radiation. Onset was several months ago. Symptoms have been gradually worsening since. Aggravating factors: eating.  Alleviating factors: apple cider vinegar. Associated symptoms: anorexia, belching, constipation, headache and nausea. The patient denies chills, dysuria, fever, hematuria and vomiting.   Also feels baseline nausea without vomiting which is worse. Swelling in the abdominal area with tight fitting clothes in the waist. Swelling and bloating. Poor appetite. Fatigued after meals especially. Early satiety.   Chronic hip pain. Sinus issues. Feels like she has a sinus infection. Daying nasonex daytime, afrin at night, can't take antihistamine due to worse headaches.    Worried about gaining weight. Exercising more, trying to eat right, weight loss clinic with bhcg shots and another shot unknown name. Not seeing any results. Can't tolerate from the phentermine, makes migraines worse.    Patient Active Problem List   Diagnosis Date Noted  . Obesity, Class I, BMI 30-34.9 06/20/2014  . Right upper quadrant abdominal pain 06/20/2014  . Intractable chronic migraine without aura and without status migrainosus 04/18/2014  . Intractable chronic migraine without aura 09/15/2012    History  Substance Use Topics  . Smoking status: Never Smoker   . Smokeless tobacco: Never Used  . Alcohol Use: No     Current outpatient prescriptions:  .  buPROPion (WELLBUTRIN SR) 150 MG 12 hr tablet, every morning., Disp: , Rfl: 4 .  Cyanocobalamin (B-12 COMPLIANCE INJECTION IJ), Inject as  directed., Disp: , Rfl:  .  HYDROcodone-acetaminophen (NORCO) 10-325 MG per tablet, Take 1 tablet by mouth every 6 (six) hours as needed (Must last 28 days)., Disp: 40 tablet, Rfl: 0 .  Magnesium Oxide 200 MG TABS, Take 1 tablet (200 mg total) by mouth daily., Disp: 30 tablet, Rfl: 6 .  NASONEX 50 MCG/ACT nasal spray, Place 50 g into the nose daily., Disp: , Rfl:  .  promethazine (PHENERGAN) 50 MG tablet, Take 1 tablet (50 mg total) by mouth every 6 (six) hours as needed for nausea or vomiting., Disp: 30 tablet, Rfl: 3 .  promethazine (PROMETHEGAN) 50 MG suppository, Place 1 suppository (50 mg total) rectally every 6 (six) hours as needed for vomiting., Disp: 12 suppository, Rfl: 1 .  rizatriptan (MAXALT) 10 MG tablet, TAKE 1 TABLET BY MOUTH THREE TIMES DAILY AS NEEDED. MAXIMUM OF 3 TABLETS PER 24 HOURS, Disp: 12 tablet, Rfl: 6 .  metroNIDAZOLE (METROGEL) 0.75 % vaginal gel, Place 1 Applicatorful vaginally at bedtime. Apply one applicatorful to vagina at bedtime for 5 days (Patient not taking: Reported on 06/20/2014), Disp: 70 g, Rfl: 4 .  Riboflavin 100 MG CAPS, Take 1 capsule (100 mg total) by mouth 2 (two) times daily. (Patient not taking: Reported on 06/20/2014), Disp: 60 capsule, Rfl: 11  Past Surgical History  Procedure Laterality Date  . Cervical fusion  05/06/2012  . Botox injection N/A     Dr. Anne Hahn performs these throughout her scalp. Approx 25 injections.    Family History  Problem Relation Age of Onset  . Heart attack Mother   .  Cervical cancer Mother   . Epilepsy Child     Rolandic    No Known Allergies   Review of Systems  Ten systems reviewed and is negative except as mentioned in HPI.   Objective  BP 128/80 mmHg  Pulse 74  Temp(Src) 98.4 F (36.9 C) (Oral)  Resp 16  Ht 5\' 5"  (1.651 m)  Wt 182 lb 8 oz (82.781 kg)  BMI 30.37 kg/m2  SpO2 97%  Physical Exam  Constitutional: Patient appears well-developed and well-nourished. In no distress.  HEENT:  - Head:  Normocephalic and atraumatic.  - Ears: Bilateral TMs gray, no erythema or effusion - Nose: Nasal mucosa boggy congested with maxillary sinus pain bilaterally - Mouth/Throat: Oropharynx is clear and moist. No tonsillar hypertrophy or erythema. No post nasal drainage.  - Eyes: Conjunctivae clear, EOM movements normal. PERRLA. No scleral icterus.  Neck: Normal range of motion. Neck supple. No JVD present. No thyromegaly present.  Cardiovascular: Normal rate, regular rhythm and normal heart sounds.  No murmur heard.  Pulmonary/Chest: Effort normal and breath sounds normal. No respiratory distress.  Physical Exam  Abdominal: Normal appearance and bowel sounds are normal. She exhibits no shifting dullness, no distension, no fluid wave, no abdominal bruit, no ascites and no mass. There is no hepatosplenomegaly or splenomegaly. There is tenderness in the right upper quadrant and epigastric area. There is CVA tenderness. There is no rigidity, no rebound, no guarding, no tenderness at McBurney's point and negative Murphy's sign. No hernia.     Musculoskeletal: Normal range of motion bilateral UE and LE, no joint effusions. Peripheral vascular: Bilateral LE no edema. Neurological: CN II-XII grossly intact with no focal deficits. Alert and oriented to person, place, and time. Coordination, balance, strength, speech and gait are normal.  Skin: Skin is warm and dry. No rash noted. No erythema.  Psychiatric: Patient is anxious. Behavior is normal in office today. Judgment and thought content normal in office today.   Assessment & Plan  1. Right upper quadrant abdominal pain Rule out hepatic or gallbladder pathology. If negative will treat with PPI as gastritis. Recommended probiotic initiation.  - CBC with Differential/Platelet - US Abdomen Limited RUQ; Future - Comprehensive metabolic panel  2. Pain, joint, multiple sites Baseline hip pain now progressing to multiple joint pain. Will rule out RA.  May be stress related.  - Rheumatoid factor - Sedimentation rate - Antinuclear Antib (ANA)  3. Epigastric abdominal pain Rule out pancreatic, gallbladder, liver pathology. If negative will start PPI and recommended probiotic initiation.  - Amylase - Lipase - H. pylori breath test - Comprehensive metabolic panel  4. Abdominal bloating Rule out pancreatic, gallbladder, liver pathology. If negative will start PPI and recommended probiotic initiation.  5. Acute maxillary sinusitis, recurrence not specified Educated patient on avoiding daily use of Afrin as this may cause rebound nasal congestion.  - predniSONE (STERAPRED UNI-PAK 21 TAB) 10 MG (21) TBPK tablet; Take by mouth daily. Use as directed in a 6 day taper PredPak  Dispense: 21 tablet; Refill: 0 - amoxicillin-clavulanate (AUGMENTIN) 875-125 MG per tablet; Take 1 tablet by mouth 2 (two) times daily.  Dispense: 20 tablet; Refill: 0 - fluticasone (FLONASE) 50 MCG/ACT nasal spray; Place 2 sprays into both nostrils daily.  Dispense: 16 g; Refill: 3  6. Other fatigue Lab work. Considering stress related vs mild depression. I do not recommend weight loss clinic therapies as they may not be scientifically significant treatment options and may do more harm than good.   -  TSH - Comprehensive metabolic panel  7. Allergic rhinitis with postnasal drip Started on Flonase.

## 2014-06-21 LAB — COMPREHENSIVE METABOLIC PANEL
ALK PHOS: 99 IU/L (ref 39–117)
ALT: 13 IU/L (ref 0–32)
AST: 15 IU/L (ref 0–40)
Albumin/Globulin Ratio: 1.7 (ref 1.1–2.5)
Albumin: 4.6 g/dL (ref 3.5–5.5)
BUN/Creatinine Ratio: 10 (ref 9–23)
BUN: 8 mg/dL (ref 6–24)
CALCIUM: 9.4 mg/dL (ref 8.7–10.2)
CHLORIDE: 101 mmol/L (ref 97–108)
CO2: 23 mmol/L (ref 18–29)
CREATININE: 0.8 mg/dL (ref 0.57–1.00)
GFR calc Af Amer: 101 mL/min/{1.73_m2} (ref >59)
GFR calc non Af Amer: 87 mL/min/{1.73_m2} (ref >59)
GLOBULIN, TOTAL: 2.7 g/dL (ref 1.5–4.5)
GLUCOSE: 86 mg/dL (ref 65–99)
Potassium: 4.8 mmol/L (ref 3.5–5.2)
Sodium: 143 mmol/L (ref 134–144)
TOTAL PROTEIN: 7.3 g/dL (ref 6.0–8.5)

## 2014-06-21 LAB — LIPASE: Lipase: 18 U/L (ref 0–59)

## 2014-06-21 LAB — CBC WITH DIFFERENTIAL/PLATELET
BASOS: 1 %
Basophils Absolute: 0 10*3/uL (ref 0.0–0.2)
EOS (ABSOLUTE): 0 10*3/uL (ref 0.0–0.4)
Eos: 1 %
HEMOGLOBIN: 13 g/dL (ref 11.1–15.9)
Hematocrit: 38.9 % (ref 34.0–46.6)
IMMATURE GRANS (ABS): 0 10*3/uL (ref 0.0–0.1)
Immature Granulocytes: 0 %
LYMPHS: 28 %
Lymphocytes Absolute: 1.6 10*3/uL (ref 0.7–3.1)
MCH: 30.5 pg (ref 26.6–33.0)
MCHC: 33.4 g/dL (ref 31.5–35.7)
MCV: 91 fL (ref 79–97)
Monocytes Absolute: 0.3 10*3/uL (ref 0.1–0.9)
Monocytes: 5 %
Neutrophils Absolute: 3.7 10*3/uL (ref 1.4–7.0)
Neutrophils: 65 %
Platelets: 298 10*3/uL (ref 150–379)
RBC: 4.26 x10E6/uL (ref 3.77–5.28)
RDW: 13.3 % (ref 12.3–15.4)
WBC: 5.7 10*3/uL (ref 3.4–10.8)

## 2014-06-21 LAB — SEDIMENTATION RATE: SED RATE: 12 mm/h (ref 0–32)

## 2014-06-21 LAB — AMYLASE: AMYLASE: 57 U/L (ref 31–124)

## 2014-06-21 LAB — TSH: TSH: 1.1 u[IU]/mL (ref 0.450–4.500)

## 2014-06-21 LAB — RHEUMATOID FACTOR

## 2014-06-21 LAB — ANA: Anti Nuclear Antibody(ANA): POSITIVE — AB

## 2014-06-22 ENCOUNTER — Other Ambulatory Visit: Payer: Self-pay | Admitting: Family Medicine

## 2014-06-22 DIAGNOSIS — K2900 Acute gastritis without bleeding: Secondary | ICD-10-CM

## 2014-06-22 LAB — H. PYLORI BREATH TEST: H. pylori UBiT: NEGATIVE

## 2014-06-22 MED ORDER — OMEPRAZOLE 40 MG PO CPDR: 40.0000 mg | DELAYED_RELEASE_CAPSULE | Freq: Every day | ORAL | Status: DC

## 2014-06-25 ENCOUNTER — Encounter: Payer: Self-pay | Admitting: *Deleted

## 2014-06-25 ENCOUNTER — Other Ambulatory Visit: Payer: Self-pay | Admitting: Neurology

## 2014-06-25 MED ORDER — HYDROCODONE-ACETAMINOPHEN 10-325 MG PO TABS: 1.0000 | ORAL_TABLET | Freq: Four times a day (QID) | ORAL | Status: DC | PRN

## 2014-06-25 NOTE — Telephone Encounter (Signed)
Left detailed message for pt to call us, there has been some confusion about who was giving referral

## 2014-06-25 NOTE — Progress Notes (Signed)
Hydrocodone rx. up front [facility] 

## 2014-06-25 NOTE — Telephone Encounter (Signed)
Dr Willis is out of the office.  Request forwarded to WID for review.   

## 2014-06-25 NOTE — Telephone Encounter (Signed)
Patient is calling and requesting written Rx hydrocodone 20-325 mg. Thanks!

## 2014-06-28 ENCOUNTER — Other Ambulatory Visit: Payer: Self-pay | Admitting: Family Medicine

## 2014-06-28 ENCOUNTER — Encounter: Payer: Self-pay | Admitting: *Deleted

## 2014-06-28 ENCOUNTER — Telehealth: Payer: Self-pay

## 2014-06-28 ENCOUNTER — Ambulatory Visit
Admission: RE | Admit: 2014-06-28 | Discharge: 2014-06-28 | Disposition: A | Discharge status: Home / Self Care | Payer: Managed Care, Other (non HMO) | Source: Ambulatory Visit | Attending: Family Medicine | Admitting: Family Medicine

## 2014-06-28 DIAGNOSIS — K8020 Calculus of gallbladder without cholecystitis without obstruction: Secondary | ICD-10-CM | POA: Insufficient documentation

## 2014-06-28 DIAGNOSIS — R1011 Right upper quadrant pain: Secondary | ICD-10-CM | POA: Diagnosis present

## 2014-06-28 DIAGNOSIS — K8050 Calculus of bile duct without cholangitis or cholecystitis without obstruction: Secondary | ICD-10-CM | POA: Insufficient documentation

## 2014-06-28 NOTE — Progress Notes (Signed)
Referral to [id number] placed.

## 2014-06-28 NOTE — Telephone Encounter (Signed)
Spoke with patient about her referral and explained to her that I called [facility] and that they explained to me that once a patient has confirmed diagnosis of gallstones on an Ultrasound, they usually bypass their clinic are sent to the Surgeon. Pt understands and agrees to Surgery consultation. I have sent referral to [facility] and will wait for the appointment and call patient once I receive it.

## 2014-07-12 ENCOUNTER — Encounter: Payer: Self-pay | Admitting: General Surgery

## 2014-07-12 ENCOUNTER — Ambulatory Visit (INDEPENDENT_AMBULATORY_CARE_PROVIDER_SITE_OTHER): Payer: Managed Care, Other (non HMO) | Admitting: General Surgery

## 2014-07-12 VITALS — BP 122/78 | HR 68 | Resp 14 | Ht 65.0 in | Wt 182.0 lb

## 2014-07-12 DIAGNOSIS — K8020 Calculus of gallbladder without cholecystitis without obstruction: Secondary | ICD-10-CM | POA: Diagnosis not present

## 2014-07-12 NOTE — Patient Instructions (Addendum)

## 2014-07-12 NOTE — Progress Notes (Signed)
Patient ID: Mindy Jenkins, female   DOB: 24-Nov-1965, 49 y.o.   MRN: [id number]  Chief Complaint  Patient presents with  . Other    gall stones    HPI Mindy Jenkins is a 49 y.o. female.  Here today for evaluation of gallstones.Ultrasound completed 06-28-14. She states that for the past 3-6 months she has been having right upper abdominal pain near rib area. She states that for several weeks it seems to a constant soreness and ache feeling. Yesterday the pain seemed much worse than before. She does states she has to avoid fatty foods because it makes it worse. The pain occurs maybe one hour after meals. She does admit to being "bloated and full feeling". She does have constant nausea and only one episode of vomiting. She admits to her appetite not being at its best.  HPI  Past Medical History  Diagnosis Date  . Migraine without aura, with intractable migraine, so stated, without mention of status migrainosus     Past Surgical History  Procedure Laterality Date  . Cervical fusion  05/06/2012  . Botox injection N/A     Dr. Anne HahnWillis performs these throughout her scalp. Approx 25 injections.    Family History  Problem Relation Age of Onset  . Heart attack Mother   . Cervical cancer Mother   . Epilepsy Child     [place]and[place]    Social History History  Substance Use Topics  . Smoking status: Never Smoker   . Smokeless tobacco: Never Used  . Alcohol Use: No    No Known Allergies  Current Outpatient Prescriptions  Medication Sig Dispense Refill  . buPROPion (WELLBUTRIN SR) 100 MG 12 hr tablet TK 1 T PO QPM  2  . buPROPion (WELLBUTRIN SR) 150 MG 12 hr tablet TK 1 T PO QAM  1  . fluticasone (FLONASE) 50 MCG/ACT nasal spray Place 2 sprays into both nostrils daily. 16 g 3  . HYDROcodone-acetaminophen (NORCO) 10-325 MG per tablet Take 1 tablet by mouth every 6 (six) hours as needed (Must last 28 days). 40 tablet 0  . Magnesium Oxide 200 MG TABS Take 1 tablet (200 mg total) by  mouth daily. 30 tablet 6  . NASONEX 50 MCG/ACT nasal spray Place 50 g into the nose daily.    . promethazine (PHENERGAN) 50 MG tablet Take 1 tablet (50 mg total) by mouth every 6 (six) hours as needed for nausea or vomiting. 30 tablet 3  . promethazine (PROMETHEGAN) 50 MG suppository Place 1 suppository (50 mg total) rectally every 6 (six) hours as needed for vomiting. 12 suppository 1  . rizatriptan (MAXALT) 10 MG tablet TAKE 1 TABLET BY MOUTH THREE TIMES DAILY AS NEEDED. MAXIMUM OF 3 TABLETS PER 24 HOURS 12 tablet 6   No current facility-administered medications for this visit.    Review of Systems Review of Systems  Constitutional: Negative.   Respiratory: Negative.   Cardiovascular: Negative.   Gastrointestinal: Positive for nausea, vomiting and abdominal pain. Negative for diarrhea and constipation.    Blood pressure 122/78, pulse 68, resp. rate 14, height 5\' 5"  (1.651 m), weight 182 lb (82.555 kg).  Physical Exam Physical Exam  Constitutional: She is oriented to person, place, and time. She appears well-developed and well-nourished.  HENT:  Mouth/Throat: Oropharynx is clear and moist.  Eyes: Conjunctivae are normal. No scleral icterus.  Neck: Neck supple.  Cardiovascular: Normal rate, regular rhythm and normal heart sounds.   Pulses:      Dorsalis  No current facility-administered medications for this visit.      Review of Systems Review of Systems  Constitutional: Negative.   Respiratory: Negative.   Cardiovascular: Negative.   Gastrointestinal: Positive for nausea, vomiting and abdominal pain. Negative for diarrhea and constipation.    Blood pressure 122/78, pulse 68, resp. rate 14, height 5' 5" (1.651 m), weight 182 lb (82.555 kg).   Physical Exam Physical Exam  Constitutional: She is oriented to person, place, and time. She appears well-developed and well-nourished.  HENT:   Mouth/Throat: Oropharynx is clear and moist.  Eyes: Conjunctivae are normal. No scleral icterus.  Neck: Neck supple.  Cardiovascular: Normal rate, regular rhythm and normal heart sounds.    Pulses:      Dorsalis pedis pulses are 2+ on the right side, and 2+ on the left side.  No lower leg edema  Pulmonary/Chest: Effort normal and breath sounds normal.  Abdominal: Soft. Normal appearance and bowel sounds are normal. There is tenderness in the right upper quadrant.  Lymphadenopathy:    She has no cervical adenopathy.  Neurological: She is alert and oriented to person, place, and time.  Skin: Skin is warm and dry.  Psychiatric: She has a normal mood and affect.    Data Reviewed Abdominal ultrasound of  06/28/2014 was reviewed. Multiple gallstones. Normal common bile duct. Normal gallbladder wall thickness. Laboratory studies of 06/20/2014 including a CBC, comprehensive metabolic panel and breath analysis for H. pylori were all normal.     Assessment Symptomatic cholelithiasis.      Plan     Laparoscopic Cholecystectomy with Intraoperative Cholangiogram. The procedure, including it's potential risks and complications (including but not limited to infection, bleeding, injury to intra-abdominal organs or bile ducts, bile leak, poor cosmetic result, sepsis and death) were discussed with the patient in detail. Non-operative options, including their inherent risks (acute calculous cholecystitis with possible choledocholithiasis or gallstone pancreatitis, with the risk of ascending cholangitis, sepsis, and death) were discussed as well. The patient expressed and understanding of what we discussed and wishes to proceed with laparoscopic cholecystectomy. The patient further understands that if it is technically not possible, or it is unsafe to proceed laparoscopically, that I will convert to an open cholecystectomy.   Patient's surgery has been scheduled for 07-31-14 at [facility].   PCP:  Sundaram, Ashany   Delray Reza W 07/14/2014, 9:19 AM    

## 2014-07-14 DIAGNOSIS — K8020 Calculus of gallbladder without cholecystitis without obstruction: Secondary | ICD-10-CM | POA: Insufficient documentation

## 2014-07-14 NOTE — H&P (Signed)
Patient ID: Mindy Jenkins, female   DOB: May 23, 1965, 49 y.o.   MRN: [id number]    Chief Complaint   Patient presents with   .  Other       gall stones      HPI Mindy Jenkins is a 49 y.o. female.  Here today for evaluation of gallstones.Ultrasound completed 06-28-14. She states that for the past 3-6 months she has been having right upper abdominal pain near rib area. She states that for several weeks it seems to a constant soreness and ache feeling. Yesterday the pain seemed much worse than before. She does states she has to avoid fatty foods because it makes it worse. The pain occurs maybe one hour after meals. She does admit to being "bloated and full feeling". She does have constant nausea and only one episode of vomiting. She admits to her appetite not being at its best.   HPI    Past Medical History   Diagnosis  Date   .  Migraine without aura, with intractable migraine, so stated, without mention of status migrainosus         Past Surgical History   Procedure  Laterality  Date   .  Cervical fusion    05/06/2012   .  Botox injection  N/A         Dr. Anne Hahn performs these throughout her scalp. Approx 25 injections.       Family History   Problem  Relation  Age of Onset   .  Heart attack  Mother     .  Cervical cancer  Mother     .  Epilepsy  Child         [place]      Social History History   Substance Use Topics   .  Smoking status:  Never Smoker    .  Smokeless tobacco:  Never Used   .  Alcohol Use:  No      No Known Allergies    Current Outpatient Prescriptions   Medication  Sig  Dispense  Refill   .  buPROPion (WELLBUTRIN SR) 100 MG 12 hr tablet  TK 1 T PO QPM    2   .  buPROPion (WELLBUTRIN SR) 150 MG 12 hr tablet  TK 1 T PO QAM    1   .  fluticasone (FLONASE) 50 MCG/ACT nasal spray  Place 2 sprays into both nostrils daily.  16 g  3   .  HYDROcodone-acetaminophen (NORCO) 10-325 MG per tablet  Take 1 tablet by mouth every 6 (six) hours as needed  (Must last 28 days).  40 tablet  0   .  Magnesium Oxide 200 MG TABS  Take 1 tablet (200 mg total) by mouth daily.  30 tablet  6   .  NASONEX 50 MCG/ACT nasal spray  Place 50 g into the nose daily.       .  promethazine (PHENERGAN) 50 MG tablet  Take 1 tablet (50 mg total) by mouth every 6 (six) hours as needed for nausea or vomiting.  30 tablet  3   .  promethazine (PROMETHEGAN) 50 MG suppository  Place 1 suppository (50 mg total) rectally every 6 (six) hours as needed for vomiting.  12 suppository  1   .  rizatriptan (MAXALT) 10 MG tablet  TAKE 1 TABLET BY MOUTH THREE TIMES DAILY AS NEEDED. MAXIMUM OF 3 TABLETS PER 24 HOURS  12 tablet  6  No current facility-administered medications for this visit.      Review of Systems Review of Systems  Constitutional: Negative.   Respiratory: Negative.   Cardiovascular: Negative.   Gastrointestinal: Positive for nausea, vomiting and abdominal pain. Negative for diarrhea and constipation.    Blood pressure 122/78, pulse 68, resp. rate 14, height 5\' 5"  (1.651 m), weight 182 lb (82.555 kg).   Physical Exam Physical Exam  Constitutional: She is oriented to person, place, and time. She appears well-developed and well-nourished.  HENT:   Mouth/Throat: Oropharynx is clear and moist.  Eyes: Conjunctivae are normal. No scleral icterus.  Neck: Neck supple.  Cardiovascular: Normal rate, regular rhythm and normal heart sounds.    Pulses:      Dorsalis pedis pulses are 2+ on the right side, and 2+ on the left side.  No lower leg edema  Pulmonary/Chest: Effort normal and breath sounds normal.  Abdominal: Soft. Normal appearance and bowel sounds are normal. There is tenderness in the right upper quadrant.  Lymphadenopathy:    She has no cervical adenopathy.  Neurological: She is alert and oriented to person, place, and time.  Skin: Skin is warm and dry.  Psychiatric: She has a normal mood and affect.    Data Reviewed Abdominal ultrasound of  06/28/2014 was reviewed. Multiple gallstones. Normal common bile duct. Normal gallbladder wall thickness. Laboratory studies of 06/20/2014 including a CBC, comprehensive metabolic panel and breath analysis for H. pylori were all normal.     Assessment Symptomatic cholelithiasis.      Plan     Laparoscopic Cholecystectomy with Intraoperative Cholangiogram. The procedure, including it's potential risks and complications (including but not limited to infection, bleeding, injury to intra-abdominal organs or bile ducts, bile leak, poor cosmetic result, sepsis and death) were discussed with the patient in detail. Non-operative options, including their inherent risks (acute calculous cholecystitis with possible choledocholithiasis or gallstone pancreatitis, with the risk of ascending cholangitis, sepsis, and death) were discussed as well. The patient expressed and understanding of what we discussed and wishes to proceed with laparoscopic cholecystectomy. The patient further understands that if it is technically not possible, or it is unsafe to proceed laparoscopically, that I will convert to an open cholecystectomy.   [facility].   PCP:  Rogelia MireSundaram, Ashany   Destyni Hoppel W 07/14/2014, 9:19 AM

## 2014-07-18 ENCOUNTER — Telehealth: Payer: Self-pay | Admitting: *Deleted

## 2014-07-18 NOTE — Telephone Encounter (Signed)
Patient's surgery has been moved up from 07-31-14 to [facility].  Leah in the O.R. has been notified of date change.  Patient notified of date change and appreciative.

## 2014-07-18 NOTE — Telephone Encounter (Signed)
I spoke with the patient regards to the symptoms noted in the phone report below. She's having pain some nausea. She is not making use of Phenergan, so this can't be blamed for her fatigue. She's had come home from work 2 days this week far earlier than she anticipated due to her discomfort and general fatigue.  She's been battling this now for 6-9 months, and it may just be that she is "worn out". I didn't see anything on her exam or imaging studies to think that there is a more sinister process ongoing.  The only option I had offered at this time is early surgical intervention, and she'll speak with the scheduling secretary in this regard.

## 2014-07-18 NOTE — Telephone Encounter (Signed)
Patient called the office to report major fatigue. This patient states it's almost like she has mono because she is so tired. She states she has been going to bed around 5-6 pm and sleeping till the next day with the exception that she does wake up in the middle of the night in pain. This has been going on since last Friday.  She has not noticed that any specific food that has triggered a gallbladder attack. Patient states she is not able to eat that much anyway.   Patient reports no vomiting since last week but does have constant nausea. Also, reports a little diarrhea today but none prior. No temperature to report at this time.  This patient is scheduled for gall[facility]adder [facility] on 07[facility].    She would like to know what recommendations we have in the meantime.

## 2014-07-24 ENCOUNTER — Encounter: Payer: Self-pay | Admitting: *Deleted

## 2014-07-24 ENCOUNTER — Inpatient Hospital Stay: Admission: RE | Admit: 2014-07-24 | Payer: Managed Care, Other (non HMO) | Source: Ambulatory Visit

## 2014-07-24 NOTE — Patient Instructions (Signed)
  Your procedure is scheduled on: 07-25-14 Report to [facility] 2ND FLOOR To find out your arrival time please call [phone] between 1PM - 3PM on 07-24-14  Remember: Instructions that are not followed completely may result in serious medical risk, up to and including death, or upon the discretion of your surgeon and anesthesiologist your surgery may need to be rescheduled.    __X__ 1. Do not eat food or drink liquids after midnight. No gum chewing or hard candies.     __X__ 2. No Alcohol for 24 hours before or after surgery.   ____ 3. Bring all medications with you on the day of surgery if instructed.    ____ 4. Notify your doctor if there is any change in your medical condition     (cold, fever, infections).     Do not wear jewelry, make-up, hairpins, clips or nail polish.  Do not wear lotions, powders, or perfumes. You may wear deodorant.  Do not shave 48 hours prior to surgery. Men may shave face and neck.  Do not bring valuables to the hospital.    [facility] is not responsible for any belongings or valuables.               Contacts, dentures or bridgework may not be worn into surgery.  Leave your suitcase in the car. After surgery it may be brought to your room.  For patients admitted to the hospital, discharge time is determined by your   treatment team.   Patients discharged the day of surgery will not be allowed to drive home.   Please read over the following fact sheets that you were given:      __X__ Take these medicines the morning of surgery with A SIP OF WATER:    1. WELLBUTRIN  2. MAY TAKE HYDROCODONE AND PHENERGAN IF NEEDED  3.   4.  5.  6.  ____ Fleet Enema (as directed)   ____ Use CHG Soap as directed  ____ Use inhalers on the day of surgery  ____ Stop metformin 2 days prior to surgery    ____ Take 1/2 of usual insulin dose the night before surgery and none on the morning of surgery.   ____ Stop  Coumadin/Plavix/aspirin-N/A  _X___ Stop Anti-inflammatories-STOP ADVIL NOW-NO NSAIDS OR ASA PRODUCTS-TYLENOL OR HYDROCODONE OK   ____ Stop supplements until after surgery.    ____ Bring C-Pap to the hospital.

## 2014-07-25 ENCOUNTER — Ambulatory Visit: Payer: Managed Care, Other (non HMO)

## 2014-07-25 ENCOUNTER — Encounter: Payer: Self-pay | Admitting: General Surgery

## 2014-07-25 ENCOUNTER — Encounter
Admission: RE | Disposition: A | Discharge status: Home / Self Care | Payer: Self-pay | Source: Ambulatory Visit | Attending: General Surgery

## 2014-07-25 ENCOUNTER — Ambulatory Visit: Payer: Managed Care, Other (non HMO) | Admitting: Certified Registered Nurse Anesthetist

## 2014-07-25 ENCOUNTER — Ambulatory Visit
Admission: RE | Admit: 2014-07-25 | Discharge: 2014-07-25 | Disposition: A | Discharge status: Home / Self Care | Payer: Managed Care, Other (non HMO) | Source: Ambulatory Visit | Attending: General Surgery | Admitting: General Surgery

## 2014-07-25 DIAGNOSIS — R51 Headache: Secondary | ICD-10-CM | POA: Diagnosis not present

## 2014-07-25 DIAGNOSIS — D649 Anemia, unspecified: Secondary | ICD-10-CM | POA: Diagnosis not present

## 2014-07-25 DIAGNOSIS — K8010 Calculus of gallbladder with chronic cholecystitis without obstruction: Secondary | ICD-10-CM | POA: Insufficient documentation

## 2014-07-25 DIAGNOSIS — K8020 Calculus of gallbladder without cholecystitis without obstruction: Secondary | ICD-10-CM

## 2014-07-25 HISTORY — PX: CHOLECYSTECTOMY: SHX55

## 2014-07-25 HISTORY — DX: Anemia, unspecified: D64.9

## 2014-07-25 SURGERY — LAPAROSCOPIC CHOLECYSTECTOMY WITH INTRAOPERATIVE CHOLANGIOGRAM
Anesthesia: General | Wound class: Clean Contaminated

## 2014-07-25 MED ORDER — GLYCOPYRROLATE 0.2 MG/ML IJ SOLN
INTRAMUSCULAR | Status: DC | PRN
  Administered 2014-07-25: .5 mg via INTRAVENOUS

## 2014-07-25 MED ORDER — SODIUM CHLORIDE 0.9 % IJ SOLN
INTRAMUSCULAR | Status: AC
  Filled 2014-07-25: qty 50

## 2014-07-25 MED ORDER — HYDROCODONE-ACETAMINOPHEN 10-325 MG PO TABS
1.0000 | ORAL_TABLET | Freq: Four times a day (QID) | ORAL | Status: DC | PRN
  Administered 2014-07-25: 1 via ORAL

## 2014-07-25 MED ORDER — ROCURONIUM BROMIDE 100 MG/10ML IV SOLN
INTRAVENOUS | Status: DC | PRN
  Administered 2014-07-25: 35 mg via INTRAVENOUS

## 2014-07-25 MED ORDER — ONDANSETRON HCL 4 MG/2ML IJ SOLN: 4.0000 mg | Freq: Once | INTRAMUSCULAR | Status: DC | PRN

## 2014-07-25 MED ORDER — FENTANYL CITRATE (PF) 100 MCG/2ML IJ SOLN
25.0000 ug | INTRAMUSCULAR | Status: DC | PRN
  Administered 2014-07-25 (×4): 25 ug via INTRAVENOUS

## 2014-07-25 MED ORDER — SODIUM CHLORIDE 0.9 % IJ SOLN
INTRAMUSCULAR | Status: AC
  Filled 2014-07-25: qty 10

## 2014-07-25 MED ORDER — ONDANSETRON HCL 4 MG/2ML IJ SOLN
INTRAMUSCULAR | Status: AC
  Administered 2014-07-25: 4 mg via INTRAVENOUS
  Filled 2014-07-25: qty 2

## 2014-07-25 MED ORDER — FENTANYL CITRATE (PF) 100 MCG/2ML IJ SOLN
INTRAMUSCULAR | Status: AC
  Administered 2014-07-25: 25 ug via INTRAVENOUS
  Filled 2014-07-25: qty 2

## 2014-07-25 MED ORDER — DEXAMETHASONE SODIUM PHOSPHATE 4 MG/ML IJ SOLN
INTRAMUSCULAR | Status: DC | PRN
  Administered 2014-07-25: 5 mg via INTRAVENOUS

## 2014-07-25 MED ORDER — KETOROLAC TROMETHAMINE 30 MG/ML IJ SOLN
INTRAMUSCULAR | Status: DC | PRN
  Administered 2014-07-25: 30 mg via INTRAVENOUS

## 2014-07-25 MED ORDER — ONDANSETRON HCL 4 MG/2ML IJ SOLN
4.0000 mg | Freq: Once | INTRAMUSCULAR | Status: AC
  Administered 2014-07-25: 4 mg via INTRAVENOUS

## 2014-07-25 MED ORDER — HYDROCODONE-ACETAMINOPHEN 10-325 MG PO TABS
ORAL_TABLET | ORAL | Status: AC
  Filled 2014-07-25: qty 1

## 2014-07-25 MED ORDER — FAMOTIDINE 20 MG PO TABS
20.0000 mg | ORAL_TABLET | Freq: Once | ORAL | Status: AC
  Administered 2014-07-25: 20 mg via ORAL

## 2014-07-25 MED ORDER — NALOXONE HCL 0.4 MG/ML IJ SOLN
INTRAMUSCULAR | Status: DC | PRN
  Administered 2014-07-25: 80 ug via INTRAVENOUS

## 2014-07-25 MED ORDER — FENTANYL CITRATE (PF) 100 MCG/2ML IJ SOLN
INTRAMUSCULAR | Status: DC | PRN
  Administered 2014-07-25: 150 ug via INTRAVENOUS

## 2014-07-25 MED ORDER — LACTATED RINGERS IV SOLN
INTRAVENOUS | Status: DC
  Administered 2014-07-25: 12:00:00 via INTRAVENOUS

## 2014-07-25 MED ORDER — PROMETHAZINE HCL 25 MG/ML IJ SOLN
6.2500 mg | Freq: Once | INTRAMUSCULAR | Status: AC
  Administered 2014-07-25: 6.25 mg via INTRAVENOUS

## 2014-07-25 MED ORDER — ACETAMINOPHEN 10 MG/ML IV SOLN
INTRAVENOUS | Status: AC
  Filled 2014-07-25: qty 100

## 2014-07-25 MED ORDER — ACETAMINOPHEN 10 MG/ML IV SOLN
INTRAVENOUS | Status: DC | PRN
  Administered 2014-07-25: 1000 mg via INTRAVENOUS

## 2014-07-25 MED ORDER — MIDAZOLAM HCL 2 MG/2ML IJ SOLN
INTRAMUSCULAR | Status: DC | PRN
  Administered 2014-07-25: 2 mg via INTRAVENOUS

## 2014-07-25 MED ORDER — PROPOFOL 10 MG/ML IV BOLUS
INTRAVENOUS | Status: DC | PRN
  Administered 2014-07-25: 150 mg via INTRAVENOUS

## 2014-07-25 MED ORDER — SODIUM CHLORIDE 0.9 % IV SOLN
INTRAVENOUS | Status: DC | PRN
  Administered 2014-07-25: 8 mL

## 2014-07-25 MED ORDER — PROMETHAZINE HCL 25 MG/ML IJ SOLN
INTRAMUSCULAR | Status: AC
  Administered 2014-07-25: 6.25 mg via INTRAVENOUS
  Filled 2014-07-25: qty 1

## 2014-07-25 MED ORDER — LIDOCAINE HCL (CARDIAC) 20 MG/ML IV SOLN
INTRAVENOUS | Status: DC | PRN
Start: 2014-07-25 — End: 2014-07-25
  Administered 2014-07-25: 100 mg via INTRAVENOUS

## 2014-07-25 MED ORDER — PROMETHAZINE HCL 25 MG/ML IJ SOLN: 12.5000 mg | INTRAMUSCULAR | Status: DC | PRN

## 2014-07-25 MED ORDER — NEOSTIGMINE METHYLSULFATE 10 MG/10ML IV SOLN
INTRAVENOUS | Status: DC | PRN
  Administered 2014-07-25: 3 mg via INTRAVENOUS

## 2014-07-25 MED ORDER — FAMOTIDINE 20 MG PO TABS
ORAL_TABLET | ORAL | Status: AC
  Administered 2014-07-25: 20 mg via ORAL
  Filled 2014-07-25: qty 1

## 2014-07-25 SURGICAL SUPPLY — 45 items
APL SKNCLS STERI-STRIP NONHPOA (GAUZE/BANDAGES/DRESSINGS) ×1
APPLIER CLIP ROT 10 11.4 M/L (STAPLE) ×3
APR CLP MED LRG 11.4X10 (STAPLE) ×1
BENZOIN TINCTURE PRP APPL 2/3 (GAUZE/BANDAGES/DRESSINGS) ×2 IMPLANT
BLADE SURG 11 STRL SS SAFETY (MISCELLANEOUS) ×3 IMPLANT
CANISTER SUCT 1200ML W/VALVE (MISCELLANEOUS) ×3 IMPLANT
CANNULA DILATOR  5MM W/SLV (CANNULA) ×2
CANNULA DILATOR 12 W/SLV (CANNULA) ×2 IMPLANT
CANNULA DILATOR 12MM W/SLV (CANNULA) ×1
CANNULA DILATOR 5 W/SLV (CANNULA) ×4 IMPLANT
CATH CHOLANG 76X19 KUMAR (CATHETERS) ×3 IMPLANT
CHLORAPREP W/TINT 26ML (MISCELLANEOUS) ×3 IMPLANT
CLIP APPLIE ROT 10 11.4 M/L (STAPLE) ×1 IMPLANT
CLOSURE WOUND 1/2 X4 (GAUZE/BANDAGES/DRESSINGS) ×1
CONRAY 60ML FOR OR (MISCELLANEOUS) ×3 IMPLANT
COVER LIGHT HANDLE STERIS (MISCELLANEOUS) ×2 IMPLANT
DISSECTOR KITTNER STICK (MISCELLANEOUS) ×1 IMPLANT
DISSECTORS/KITTNER STICK (MISCELLANEOUS)
DRAPE SHEET LG 3/4 BI-LAMINATE (DRAPES) ×3 IMPLANT
DRESSING TELFA 4X3 1S ST N-ADH (GAUZE/BANDAGES/DRESSINGS) ×3 IMPLANT
DRSG TEGADERM 2-3/8X2-3/4 SM (GAUZE/BANDAGES/DRESSINGS) ×6 IMPLANT
DRSG TELFA 3X8 NADH (GAUZE/BANDAGES/DRESSINGS) ×3 IMPLANT
ENDOPOUCH RETRIEVER 10 (MISCELLANEOUS) ×3 IMPLANT
GLOVE BIO SURGEON STRL SZ7.5 (GLOVE) ×7 IMPLANT
GLOVE INDICATOR 8.0 STRL GRN (GLOVE) ×7 IMPLANT
GOWN STRL REUS W/ TWL LRG LVL3 (GOWN DISPOSABLE) ×3 IMPLANT
GOWN STRL REUS W/TWL LRG LVL3 (GOWN DISPOSABLE) ×9
IRRIGATION STRYKERFLOW (MISCELLANEOUS) ×1 IMPLANT
IRRIGATOR STRYKERFLOW (MISCELLANEOUS) ×3
IV LACTATED RINGERS 1000ML (IV SOLUTION) ×3 IMPLANT
KIT RM TURNOVER STRD PROC AR (KITS) ×3 IMPLANT
LABEL OR SOLS (LABEL) ×3 IMPLANT
NS IRRIG 500ML POUR BTL (IV SOLUTION) ×3 IMPLANT
PACK LAP CHOLECYSTECTOMY (MISCELLANEOUS) ×3 IMPLANT
PAD DRESSING TELFA 3X8 NADH (GAUZE/BANDAGES/DRESSINGS) IMPLANT
PAD GROUND ADULT SPLIT (MISCELLANEOUS) ×3 IMPLANT
SCISSORS METZENBAUM CVD 33 (INSTRUMENTS) ×3 IMPLANT
SEAL FOR SCOPE WARMER C3101 (MISCELLANEOUS) ×3 IMPLANT
STRIP CLOSURE SKIN 1/2X4 (GAUZE/BANDAGES/DRESSINGS) ×2 IMPLANT
SUT VIC AB 0 CT2 27 (SUTURE) ×3 IMPLANT
SUT VIC AB 4-0 FS2 27 (SUTURE) ×3 IMPLANT
SWABSTK COMLB BENZOIN TINCTURE (MISCELLANEOUS) ×3 IMPLANT
TROCAR XCEL NON-BLD 11X100MML (ENDOMECHANICALS) ×3 IMPLANT
TUBING INSUFFLATOR HI FLOW (MISCELLANEOUS) ×3 IMPLANT
WATER STERILE IRR 1000ML POUR (IV SOLUTION) ×3 IMPLANT

## 2014-07-25 NOTE — Transfer of Care (Signed)
Immediate Anesthesia Transfer of Care Note  Patient: Mindy Jenkins  Procedure(s) Performed: Procedure(s): LAPAROSCOPIC CHOLECYSTECTOMY WITH INTRAOPERATIVE CHOLANGIOGRAM (N/A)  Patient Location: PACU  Anesthesia Type:General  Level of Consciousness: awake and patient cooperative  Airway & Oxygen Therapy: Patient Spontanous Breathing and Patient connected to nasal cannula oxygen  Post-op Assessment: Report given to RN and Post -op Vital signs reviewed and stable  Post vital signs: Reviewed and stable  Last Vitals:  Filed Vitals:   07/25/14 1354  BP: 147/92  Pulse:   Temp: 36.1 C  Resp: 18    Complications: No apparent anesthesia complications

## 2014-07-25 NOTE — Op Note (Signed)
Preoperative diagnosis: Chronic cholecystitis and cholelithiasis.  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  Operating surgeon Lane HackerJeffery Kiaan Overholser, M.D.  Anesthesia: Gen. endotracheal.  Estimated blood loss: Less than 5 mL.  Clinical note: This 49 year old has had nausea and right upper quadrant pain. Ultrasound showed evidence of cholelithiasis. She was admitted for elective cholecystectomy.  Operative note: With the patient under adequate general endotracheal anesthesia the abdomen was prepped with chlor prep and drape. In Trendelenburg position a varies needle was placed with trans-of a local incision. After assuring intra-abdominal location with a drop test the abdomen was insufflated with CO2 a 10 mmHg pressure. A 12 mm step port was expanded and inspection showed no evidence of injury from initial port placement. The patient was placed in reverse Trendelenburg position and rolled to the left. Extensive adhesions from the liver surface to the anterior abdominal wall were identified consistent with Hughie Clossurtis Fitzhugh syndrome. An 11 mm XL port was placed in the epigastrium tip. 25 mm step ports were placed laterally under direct vision. The adhesions in the upper quadrant were divided with the cautery scissors. The adhesions around the base of the gallbladder were divided in a similar fashion. The gallbladder was placed on cephalad traction. The duodenum was swept medially. The cystic duct was identified and cleared. Fluoroscopic cholangiograms were completed using a Kumar catheter and 8 mL of one half strength Conray 60. Injection showed prompt filling of the right and left hepatic radicles and free flow to the duodenum with a normal tapered distal duct. No evidence of retained stones. The cystic duct and cystic artery were doubly clipped and divided. The gallbladder was removed from the  liver bed making use of hook cautery dissection. A rent in the gallbladder did spill a small amount of bile but no stones. This was controlled and the gallbladder placed into an Endo Catch bag. The gallbladder was then delivered through the umbilical port site. The right upper quadrant was irrigated with lactated Ringer solution. Good hemostasis was noted. Inspection from the epigastric site showed no evidence of injury from initial port placement.  The patient was placed in the supine position in the right upper quadrant irrigated. Under direct vision the ports were removed and the abdomen desufflated. The fascia at the umbilicus was closed with an 0 Vicryls figure-of-eight suture. Skin incisions were closed with 4-0 Vicryls subcuticular sutures. Benzoin, Steri-Strips, Telfa and dressings were applied.  The patient tolerated the procedure well and was brought to recovery in stable condition.

## 2014-07-25 NOTE — Anesthesia Preprocedure Evaluation (Addendum)
Anesthesia Evaluation  Patient identified by MRN, date of birth, ID band Patient awake    Reviewed: Allergy & Precautions, NPO status , Patient's Chart, lab work & pertinent test results  Airway Mallampati: II       Dental no notable dental hx.    Pulmonary neg pulmonary ROS,    Pulmonary exam normal       Cardiovascular negative cardio ROS Normal cardiovascular exam    Neuro/Psych  Headaches,    GI/Hepatic negative GI ROS, Neg liver ROS,   Endo/Other  negative endocrine ROS  Renal/GU negative Renal ROS     Musculoskeletal negative musculoskeletal ROS (+)   Abdominal Normal abdominal exam  (+)   Peds  Hematology  (+) anemia ,   Anesthesia Other Findings   Reproductive/Obstetrics negative OB ROS                            Anesthesia Physical Anesthesia Plan  ASA: II  Anesthesia Plan: General   Post-op Pain Management:    Induction: Intravenous  Airway Management Planned: Oral ETT  Additional Equipment:   Intra-op Plan:   Post-operative Plan: Extubation in OR  Informed Consent: I have reviewed the patients History and Physical, chart, labs and discussed the procedure including the risks, benefits and alternatives for the proposed anesthesia with the patient or authorized representative who has indicated his/her understanding and acceptance.     Plan Discussed with: CRNA  Anesthesia Plan Comments:         Anesthesia Quick Evaluation

## 2014-07-25 NOTE — H&P (Signed)
Increasing symptoms prompted moving OR date. HEENT: Neg. Lungs: Clear. Cardio: RR. For gallbladder removal.

## 2014-07-25 NOTE — Discharge Instructions (Signed)

## 2014-07-25 NOTE — Anesthesia Procedure Notes (Signed)
Procedure Name: Intubation Date/Time: 07/25/2014 12:53 PM Performed by: Shirlee LimerickMARION, Mindy Lamm Pre-anesthesia Checklist: Patient identified, Emergency Drugs available, Suction available and Patient being monitored Patient Re-evaluated:Patient Re-evaluated prior to inductionOxygen Delivery Method: Circle system utilized Preoxygenation: Pre-oxygenation with 100% oxygen Intubation Type: IV induction Laryngoscope Size: Mac and 3 Grade View: Grade I Tube type: Oral Number of attempts: 1 Placement Confirmation: ETT inserted through vocal cords under direct vision,  positive ETCO2 and breath sounds checked- equal and bilateral Dental Injury: Teeth and Oropharynx as per pre-operative assessment

## 2014-07-26 ENCOUNTER — Telehealth: Payer: Self-pay | Admitting: Neurology

## 2014-07-26 ENCOUNTER — Encounter: Payer: Self-pay | Admitting: *Deleted

## 2014-07-26 DIAGNOSIS — G43719 Chronic migraine without aura, intractable, without status migrainosus: Secondary | ICD-10-CM

## 2014-07-26 LAB — SURGICAL PATHOLOGY

## 2014-07-26 NOTE — Telephone Encounter (Signed)
Patient is calling because she had gallbladder surgery last night and she has questions about HYDROcodone-acetaminophen (NORCO) 10-325 MG per tablet and rizatriptan (MAXALT) 10 MG tablet. The patient is very sick and could not discuss any further. Please call. Thank you.

## 2014-07-26 NOTE — Anesthesia Postprocedure Evaluation (Signed)
  Anesthesia Post-op Note  Patient: Mindy Jenkins  Procedure(s) Performed: Procedure(s): LAPAROSCOPIC CHOLECYSTECTOMY WITH INTRAOPERATIVE CHOLANGIOGRAM (N/A)  Anesthesia type:General  Patient location: PACU  Post pain: Pain level controlled  Post assessment: Post-op Vital signs reviewed, Patient's Cardiovascular Status Stable, Respiratory Function Stable, Patent Airway and No signs of Nausea or vomiting  Post vital signs: Reviewed and stable  Last Vitals:  Filed Vitals:   07/25/14 1546  BP: 109/72  Pulse: 71  Temp:   Resp: 18    Level of consciousness: awake, alert  and patient cooperative  Complications: No apparent anesthesia complications

## 2014-07-26 NOTE — Telephone Encounter (Signed)
I called the patient. She stated her surgeon put her on Percocet. I added this to her med list. She wanted to know if she could take maxalt after the type of surgery she had. She is not currently having a migraine, but wanted to know in case one came on. She also wanted to know if it is okay for her to get her scheduled Botox next week. I advised that she call her surgeon's office and direct these questions toward him, specifically related to her surgery. She stated she would do this.

## 2014-08-01 ENCOUNTER — Ambulatory Visit (INDEPENDENT_AMBULATORY_CARE_PROVIDER_SITE_OTHER): Payer: Managed Care, Other (non HMO) | Admitting: General Surgery

## 2014-08-01 ENCOUNTER — Encounter: Payer: Self-pay | Admitting: General Surgery

## 2014-08-01 ENCOUNTER — Ambulatory Visit (INDEPENDENT_AMBULATORY_CARE_PROVIDER_SITE_OTHER): Payer: Managed Care, Other (non HMO) | Admitting: Neurology

## 2014-08-01 ENCOUNTER — Encounter: Payer: Self-pay | Admitting: Neurology

## 2014-08-01 VITALS — BP 123/82 | HR 73 | Ht 65.0 in | Wt 173.0 lb

## 2014-08-01 VITALS — BP 112/78 | HR 72 | Resp 12 | Ht 65.0 in | Wt 178.0 lb

## 2014-08-01 DIAGNOSIS — G43719 Chronic migraine without aura, intractable, without status migrainosus: Secondary | ICD-10-CM

## 2014-08-01 DIAGNOSIS — K8020 Calculus of gallbladder without cholecystitis without obstruction: Secondary | ICD-10-CM

## 2014-08-01 MED ORDER — HYDROCODONE-ACETAMINOPHEN 10-325 MG PO TABS: ORAL_TABLET | ORAL | Status: DC

## 2014-08-01 NOTE — Progress Notes (Signed)
Patient ID: Mindy Jenkins, female   DOB: Jul 06, 1965, 49 y.o.   MRN: [id number]  Chief Complaint  Patient presents with  . Routine Post Op    gallbladder    HPI Mindy Jenkins is a 49 y.o. female here today for her post op gallbladder surgery done on 07/25/14. Patient states she is doing well.  HPI  Past Medical History  Diagnosis Date  . Migraine without aura, with intractable migraine, so stated, without mention of status migrainosus   . Anemia     H/O    Past Surgical History  Procedure Laterality Date  . Cervical fusion  05/06/2012  . Botox injection N/A     Dr. Anne Hahn performs these throughout her scalp. Approx 25 injections.  . Cholecystectomy N/A 07/25/2014    Procedure: LAPAROSCOPIC CHOLECYSTECTOMY WITH INTRAOPERATIVE CHOLANGIOGRAM;  Surgeon: Earline Mayotte, MD;  Location: [facility] ORS;  Service: General;  Laterality: N/A;    Family History  Problem Relation Age of Onset  . Heart attack Mother   . Cervical cancer Mother   . Epilepsy Child     Roland[place]    Social History History  Substance Use Topics  . Smoking status: Never Smoker   . Smokeless tobacco: Never Used  . Alcohol Use: No    No Known Allergies  Current Outpatient Prescriptions  Medication Sig Dispense Refill  . Botulinum Toxin Type A (BOTOX) 200 UNITS SOLR Inject 200 Units as directed every 3 (three) months.    Marland Kitchen buPROPion (WELLBUTRIN SR) 100 MG 12 hr tablet TK 1 T PO QAM  2  . buPROPion (WELLBUTRIN) 100 MG tablet Take 50 mg by mouth at bedtime.    . fluticasone (FLONASE) 50 MCG/ACT nasal spray Place 2 sprays into both nostrils daily. 16 g 3  . ibuprofen (ADVIL) 200 MG tablet Take 200 mg by mouth every 6 (six) hours as needed.    . Magnesium Oxide 200 MG TABS Take 1 tablet (200 mg total) by mouth daily. (Patient taking differently: Take 200 mg by mouth 2 (two) times a week. ) 30 tablet 6  . Multiple Vitamin (MULTIVITAMIN) capsule Take 1 capsule by mouth daily.    . promethazine (PHENERGAN)  50 MG tablet Take 1 tablet (50 mg total) by mouth every 6 (six) hours as needed for nausea or vomiting. 30 tablet 3  . promethazine (PROMETHEGAN) 50 MG suppository Place 1 suppository (50 mg total) rectally every 6 (six) hours as needed for vomiting. 12 suppository 1  . rizatriptan (MAXALT) 10 MG tablet TAKE 1 TABLET BY MOUTH THREE TIMES DAILY AS NEEDED. MAXIMUM OF 3 TABLETS PER 24 HOURS 12 tablet 6  . HYDROcodone-acetaminophen (NORCO) 10-325 MG per tablet Prn for headaches 40 tablet 0   No current facility-administered medications for this visit.    Review of Systems Review of Systems  Constitutional: Negative.   Respiratory: Negative.   Cardiovascular: Negative.     Blood pressure 112/78, pulse 72, resp. rate 12, height  (1.651 m), weight 178 lb (80.74 kg).  Physical Exam Physical Exam  Constitutional: She is oriented to person, place, and time. She appears well-developed and well-nourished.  HENT:  Mouth/Throat: Oropharynx is clear and moist.  Eyes: Conjunctivae are normal. No scleral icterus.  Neck: Neck supple.  Cardiovascular: Normal rate, regular rhythm and normal heart sounds.   Pulmonary/Chest: Effort normal and breath sounds normal.  Abdominal: Soft.  Port sites well healed with minimal bruising.  Lymphadenopathy:    She has no cervical adenopathy.  Neurological: She is alert and oriented to person, place, and time.  Skin: Skin is warm and dry.  Psychiatric: She has a normal mood and affect.    Data Reviewed Pathology showed chronic cholecystitis and cholelithiasis.  Assessment    Well status post cholecystectomy.    Plan          Discussed resuming activity gradually as tolerated. Limit lifting to less than 20 pound for an additional week. Proper lifting techniques reviewed. Follow up as needed.   PCP:  Rogelia Mire 08/02/2014, 7:46 PM

## 2014-08-01 NOTE — Progress Notes (Signed)
**  Botox [id number], exp 03/2017**mck,rn

## 2014-08-01 NOTE — Patient Instructions (Addendum)
Patient to return as needed gradually increase activity Limit lifting to less than 20 pound for an additional week. Proper lifting techniques reviewed.

## 2014-08-01 NOTE — Progress Notes (Signed)
PATIENT: Mindy Jenkins DOB: 11/15/1965  HISTORY OF PRESENT ILLNESS: Mindy Jenkins is a 49 year old female with a history of intractable migraines.   I have tried BOTOX injection irregularly in the past, 2013,  without significant benefit,   In early 2015, her migraine was much improved, she has gone through menopause, since Summer 2015, she began to have frequent headaches   She has migraine about 2-3 times a week, her typical migraine are right frontal area severe pounding headaches with associated light noise sensitivity, nauseous, lasting for few hours, responding to Maxalt, and Imitrex subcutaneous injection as needed.  Previously she has tried Topamax, Trokendi complains side effect of cognitive slowing, word finding difficulties, it does help her headaches. No help with Zonegran She also tried nortriptyline, Inderal, verapamil many years ago, without having her migraine under control.  UPDATE April 18 2014: She had her injection in Jan 2016, no significant improvement, she still has migraine headaches 2-3 times each week,  She is taking imitrex, phenergan, narco, as needed, Maxalt seems to work better, I have advised her at magnesium oxide 400 mg twice a day, along with riboflavin 100 mg twice a day, she wants to retry Maxalt dissolvable, which seems to help her headaches better, I also advised her to take Excedrin Migraine, when necessary NSAIDs  UPDATE July 27th 2016: She still has frequent migraines, once a week, now her headaches lasted about 2 days, triggered by heat, exertion, she is taking phenergan, Maxalt has been helpful, but often times she become very sleepy afterwards, she is also taking Narco as needed for headaches.   She had laparoscopic cholecystectomy for gallbladder stone week ago, recovering well   REVIEW OF SYSTEMS: Out of a complete 14 system review of symptoms, the patient complains only of the following symptoms, and all other reviewed systems are  negative.    ALLERGIES: No Known Allergies  HOME MEDICATIONS: Outpatient Prescriptions Prior to Visit  Medication Sig Dispense Refill  . Botulinum Toxin Type A (BOTOX) 200 UNITS SOLR Inject 200 Units as directed every 3 (three) months.    Marland Kitchen buPROPion (WELLBUTRIN SR) 100 MG 12 hr tablet TK 1 T PO QAM  2  . buPROPion (WELLBUTRIN) 100 MG tablet Take 50 mg by mouth at bedtime.    . fluticasone (FLONASE) 50 MCG/ACT nasal spray Place 2 sprays into both nostrils daily. 16 g 3  . HYDROcodone-acetaminophen (NORCO) 10-325 MG per tablet 1 tablet every 6 (six) hours as needed. headaches    . ibuprofen (ADVIL) 200 MG tablet Take 200 mg by mouth every 6 (six) hours as needed.    . Magnesium Oxide 200 MG TABS Take 1 tablet (200 mg total) by mouth daily. (Patient taking differently: Take 200 mg by mouth 2 (two) times a week. ) 30 tablet 6  . Multiple Vitamin (MULTIVITAMIN) capsule Take 1 capsule by mouth daily.    . promethazine (PHENERGAN) 50 MG tablet Take 1 tablet (50 mg total) by mouth every 6 (six) hours as needed for nausea or vomiting. 30 tablet 3  . promethazine (PROMETHEGAN) 50 MG suppository Place 1 suppository (50 mg total) rectally every 6 (six) hours as needed for vomiting. 12 suppository 1  . rizatriptan (MAXALT) 10 MG tablet TAKE 1 TABLET BY MOUTH THREE TIMES DAILY AS NEEDED. MAXIMUM OF 3 TABLETS PER 24 HOURS 12 tablet 6   No facility-administered medications prior to visit.    PAST MEDICAL HISTORY: Past Medical History  Diagnosis Date  .  Migraine without aura, with intractable migraine, so stated, without mention of status migrainosus   . Anemia     H/O    PAST SURGICAL HISTORY: Past Surgical History  Procedure Laterality Date  . Cervical fusion  05/06/2012  . Botox injection N/A     Dr. Anne Hahn performs these throughout her scalp. Approx 25 injections.  . Cholecystectomy N/A 07/25/2014    Procedure: LAPAROSCOPIC CHOLECYSTECTOMY WITH INTRAOPERATIVE CHOLANGIOGRAM;  Surgeon: Earline Mayotte, MD;  Location: [facility];  Service: General;  Laterality: N/A;    FAMILY HISTORY: Family History  Problem Relation Age of Onset  . Heart attack Mother   . Cervical cancer Mother   . Epilepsy Child     Rolandic    SOCIAL HISTORY: History   Social History  . Marital Status: Married    Spouse Name: N/A  . Number of Children: 2  . Years of Education: N/A   Occupational History  . self employed     Licensed conveyancer   Social History Main Topics  . Smoking status: Never Smoker   . Smokeless tobacco: Never Used  . Alcohol Use: No  . Drug Use: No  . Sexual Activity:    Partners: Male   Other Topics Concern  . Not on file   Social History Narrative      PHYSICAL EXAM  Filed Vitals:   08/01/14 1606  BP: 123/82  Pulse: 73  Height: 5\' 5"  (1.651 m)  Weight: 173 lb (78.472 kg)   Body mass index is 28.79 kg/(m^2).  Generalized: Well developed, in no acute distress   Neurological examination  Mentation: Alert oriented to time, place, history taking. Follows all commands speech and language fluent Cranial nerve II-XII: Pupils were equal round reactive to light. Extraocular movements were full, visual field were full on confrontational test. Facial sensation and strength were normal. Uvula tongue midline. Head turning and shoulder shrug  were normal and symmetric. Motor: The motor testing reveals 5 over 5 strength of all 4 extremities. Good symmetric motor tone is noted throughout.  Sensory: Sensory testing is intact to soft touch on all 4 extremities. No evidence of extinction is noted.  Coordination: Cerebellar testing reveals good finger-nose-finger and heel-to-shin bilaterally.  Gait and station: Gait is normal. Tandem gait is normal. Romberg is negative. No drift is seen.  Reflexes: Deep tendon reflexes are symmetric and normal bilaterally.   DIAGNOSTIC DATA (LABS, IMAGING, TESTING) - I reviewed patient records, labs, notes, testing and imaging myself where  available.   ASSESSMENT AND PLAN 49 years old Caucasian female, with history of intractable chronic migraine headaches, tried and failed multiple preventative medications in the past, came in for Botox injection as migraine prevention.  BOTOX injection was performed according to protocol by [employer]. 100 units of BOTOX was dissolved into 2 cc NS.   Total of 200 units, she [facility]  Corrugator 2 sites, 10 units Procerus 1 site, 5 unit Frontalis 4 sites,  20 units, Temporalis 8 sites,  40 units  Occipitalis 6 sites, 30 units Cervical Paraspinal, 4 sites, 20 units Trapezius, 6 sites, 30 units  45 units along bilateral cervical paraspinals  Patient tolerate the injection well. Will return for repeat injection in 3 months. I also refilled 40 tabs of Narco today    Levert Feinstein, M.D. Ph.D [facility] [address] [place], [place] [address] [phone]

## 2014-09-11 ENCOUNTER — Other Ambulatory Visit: Payer: Self-pay | Admitting: Family Medicine

## 2014-09-18 ENCOUNTER — Telehealth: Payer: Self-pay | Admitting: Family Medicine

## 2014-09-24 ENCOUNTER — Telehealth: Payer: Self-pay | Admitting: Family Medicine

## 2014-09-24 NOTE — Telephone Encounter (Signed)
Pt would like a call back about a PT referral. She would like to know who would be the best to go to for hip pain and would like a call back.

## 2014-09-24 NOTE — Telephone Encounter (Signed)
[facility] or [facility] are places I refer patients to.

## 2014-09-24 NOTE — Telephone Encounter (Signed)
Contacted this patient regarding her hip pain and she stated that she would like to proceed with PT. She did not care which one we used as long as they can get her in soon, b/c she "cannot even sleep at night due to the pain." I informed her that I will send the message to Dr. Sherley Bounds and then she should get a call from our referral coordinator or the physical therapist.

## 2014-09-25 ENCOUNTER — Other Ambulatory Visit: Payer: Self-pay | Admitting: Family Medicine

## 2014-09-25 DIAGNOSIS — M25551 Pain in right hip: Principal | ICD-10-CM

## 2014-09-25 DIAGNOSIS — G8929 Other chronic pain: Secondary | ICD-10-CM | POA: Insufficient documentation

## 2014-09-25 NOTE — Telephone Encounter (Signed)
ERRENOUS °

## 2014-09-25 NOTE — Telephone Encounter (Signed)
[facility] referral placed however is symptoms are worsening we may need to get an MRI of her hip, we did Xrays already that did not show much. Have her follow up for MRI order if symptoms not improving.

## 2014-09-25 NOTE — Telephone Encounter (Signed)
Tried to contact this patient to inform her that the referral for P/T has been placed but if she wanted to proceed with a MRI to give [place] a call.

## 2014-11-01 ENCOUNTER — Other Ambulatory Visit: Payer: Self-pay | Admitting: Family Medicine

## 2014-11-05 ENCOUNTER — Telehealth: Payer: Self-pay | Admitting: Neurology

## 2014-11-05 NOTE — Telephone Encounter (Signed)
Pt c/a appt for 11/1/6. Will call back next week to r/s

## 2014-11-06 ENCOUNTER — Ambulatory Visit: Payer: Managed Care, Other (non HMO) | Admitting: Neurology

## 2014-11-07 ENCOUNTER — Other Ambulatory Visit: Payer: Self-pay | Admitting: Family Medicine

## 2015-07-12 ENCOUNTER — Other Ambulatory Visit: Payer: Self-pay | Admitting: Obstetrics and Gynecology

## 2015-09-30 ENCOUNTER — Other Ambulatory Visit: Payer: Self-pay | Admitting: Obstetrics and Gynecology

## 2016-01-30 ENCOUNTER — Telehealth: Payer: Self-pay | Admitting: Obstetrics and Gynecology

## 2016-01-30 NOTE — Telephone Encounter (Signed)
Pt thinks she has a bacterial inf and she is fixing to go out of town, she wanted some antibiotic phoned to [facility] ([facility] [address])

## 2016-02-17 DIAGNOSIS — M654 Radial styloid tenosynovitis [de Quervain]: Secondary | ICD-10-CM | POA: Insufficient documentation

## 2016-02-17 DIAGNOSIS — M6530 Trigger finger, unspecified finger: Secondary | ICD-10-CM | POA: Insufficient documentation

## 2016-03-16 ENCOUNTER — Encounter: Payer: Managed Care, Other (non HMO) | Admitting: Family Medicine

## 2016-03-20 ENCOUNTER — Encounter: Payer: Self-pay | Admitting: Family Medicine

## 2016-03-20 ENCOUNTER — Ambulatory Visit (INDEPENDENT_AMBULATORY_CARE_PROVIDER_SITE_OTHER): Payer: BLUE CROSS/BLUE SHIELD | Admitting: Family Medicine

## 2016-03-20 VITALS — BP 110/74 | HR 94 | Temp 98.6°F | Resp 14 | Ht 64.0 in | Wt 166.3 lb

## 2016-03-20 DIAGNOSIS — N761 Subacute and chronic vaginitis: Secondary | ICD-10-CM | POA: Diagnosis not present

## 2016-03-20 DIAGNOSIS — R238 Other skin changes: Secondary | ICD-10-CM

## 2016-03-20 DIAGNOSIS — R5383 Other fatigue: Secondary | ICD-10-CM

## 2016-03-20 DIAGNOSIS — R233 Spontaneous ecchymoses: Secondary | ICD-10-CM

## 2016-03-20 DIAGNOSIS — Z0000 Encounter for general adult medical examination without abnormal findings: Secondary | ICD-10-CM

## 2016-03-20 DIAGNOSIS — Z1239 Encounter for other screening for malignant neoplasm of breast: Secondary | ICD-10-CM

## 2016-03-20 LAB — LIPID PANEL
Cholesterol: 169 mg/dL (ref <200)
HDL: 67 mg/dL (ref >50)
LDL Cholesterol: 90 mg/dL (ref <100)
TRIGLYCERIDES: 62 mg/dL (ref <150)
Total CHOL/HDL Ratio: 2.5 Ratio (ref <5.0)
VLDL: 12 mg/dL (ref <30)

## 2016-03-20 LAB — COMPLETE METABOLIC PANEL WITH GFR
ALT: 15 U/L (ref 6–29)
AST: 19 U/L (ref 10–35)
Albumin: 4 g/dL (ref 3.6–5.1)
Alkaline Phosphatase: 91 U/L (ref 33–115)
BUN: 8 mg/dL (ref 7–25)
CO2: 27 mmol/L (ref 20–31)
Calcium: 9.1 mg/dL (ref 8.6–10.2)
Chloride: 102 mmol/L (ref 98–110)
Creat: 0.69 mg/dL (ref 0.50–1.10)
GFR, Est Non African American: >89 mL/min (ref >=60)
Glucose, Bld: 84 mg/dL (ref 65–99)
Potassium: 4.4 mmol/L (ref 3.5–5.3)
SODIUM: 139 mmol/L (ref 135–146)
Total Bilirubin: 0.3 mg/dL (ref 0.2–1.2)
Total Protein: 6.9 g/dL (ref 6.1–8.1)

## 2016-03-20 MED ORDER — METRONIDAZOLE 500 MG PO TABS: 500.0000 mg | ORAL_TABLET | Freq: Two times a day (BID) | ORAL | 0 refills | Status: AC

## 2016-03-20 NOTE — Patient Instructions (Signed)
  We'll get your lab results Start the metronidazole Return in 3-4 weeks I love the books by Peggye PittHallowell and Ratey, Answers to Distraction and Driven to Distraction

## 2016-03-20 NOTE — Progress Notes (Signed)
BP 110/74   Pulse 94   Temp 98.6 F (37 C) (Oral)   Resp 14   Ht '5\' 4"'  (1.626 m)   Wt 166 lb 4.8 oz (75.4 kg)   SpO2 97%   BMI 28.55 kg/m    Subjective:    Patient ID: Mindy Jenkins, female    DOB: 1965-04-12, 51 y.o.   MRN: [id number]  HPI: CERENITI CURB is a 51 y.o. female  Chief Complaint  Patient presents with  . Annual Exam    with other issue   She has been having vaginitis for months She had one infection about three moonths ago No discharge Just the odor Fishy odor; very distinct Married, no sexual activity; no latex condoms; no douching Used boric acid for odor No fevers  She had her gallbladder removed and still sore through there and like inflammation around that area and wonders if little stone or something there; 18 months or so; happens after eating; cutting out oil; no change with greasy foods; no jaundice; appetite is fine; takes adhd medicine which sometimes decreases her appetite; no N/V; no change in color of stools; no hx of pancreatitis; no meats  Sees doctor at [facility] for psychiatrist  Menopause at age 7; had debilitating migraines; and they let up after menopause; might be two a year now;   Depression screen [facility] 2/9 03/20/2016 06/20/2014  Decreased Interest 0 0  Down, Depressed, Hopeless 0 1  PHQ - 2 Score 0 1   Relevant past medical, surgical, family and social history reviewed Past Medical History:  Diagnosis Date  . Anemia    H/O  . Migraine without aura, with intractable migraine, so stated, without mention of status migrainosus    Past Surgical History:  Procedure Laterality Date  . BOTOX INJECTION N/A    Dr. Jannifer Franklin performs these throughout her scalp. Approx 25 injections.  . CERVICAL FUSION  05/06/2012  . CHOLECYSTECTOMY N/A 07/25/2014   Procedure: LAPAROSCOPIC CHOLECYSTECTOMY WITH INTRAOPERATIVE CHOLANGIOGRAM;  Surgeon: Robert Bellow, MD;  Location: [facility] OR[facility];  Service: General;  Laterality: N/A;   Family History    Problem Relation Age of Onset  . Heart attack Mother   . Cervical cancer Mother   . Aneurysm Mother   . Epilepsy Child     Rolandic  . Heart disease Paternal Grandmother    Social History  Substance Use Topics  . Smoking status: Never Smoker  . Smokeless tobacco: Never Used  . Alcohol use No    Interim medical history since last visit reviewed. Allergies and medications reviewed  Review of Systems Per HPI unless specifically indicated above     Objective:    BP 110/74   Pulse 94   Temp 98.6 F (37 C) (Oral)   Resp 14   Ht '5\' 4"'  (1.626 m)   Wt 166 lb 4.8 oz (75.4 kg)   SpO2 97%   BMI 28.55 kg/m   Wt Readings from Last 3 Encounters:  03/20/16 166 lb 4.8 oz (75.4 kg)  08/01/14 173 lb (78.5 kg)  08/01/14 178 lb (80.7 kg)    Physical Exam  Constitutional: She appears well-developed and well-nourished.  HENT:  Mouth/Throat: Mucous membranes are normal.  Eyes: EOM are normal. No scleral icterus.  Cardiovascular: Normal rate and regular rhythm.   Pulmonary/Chest: Effort normal and breath sounds normal.  Genitourinary: No erythema, tenderness or bleeding in the vagina. No signs of injury around the vagina. Vaginal discharge found.  Skin: She is  not diaphoretic.  Psychiatric: She has a normal mood and affect. Her behavior is normal.    Results for orders placed or performed in visit on 03/20/16  WET PREP BY MOLECULAR PROBE  Result Value Ref Range   Candida species NOT DETECTED NOT DETECTED   Trichomonas vaginosis NOT DETECTED NOT DETECTED   Gardnerella vaginalis NOT DETECTED NOT DETECTED  Lipid panel  Result Value Ref Range   Cholesterol 169 <200 mg/dL   Triglycerides 62 <150 mg/dL   HDL 67 >50 mg/dL   Total CHOL/HDL Ratio 2.5 <5.0 Ratio   VLDL 12 <30 mg/dL   LDL Cholesterol 90 <100 mg/dL  CBC with Differential/Platelet  Result Value Ref Range   WBC 5.0 3.8 - 10.8 K/uL   RBC 4.15 3.80 - 5.10 MIL/uL   Hemoglobin 12.3 11.7 - 15.5 g/dL   HCT 38.1 35.0 - 45.0  %   MCV 91.8 80.0 - 100.0 fL   MCH 29.6 27.0 - 33.0 pg   MCHC 32.3 32.0 - 36.0 g/dL   RDW 13.7 11.0 - 15.0 %   Platelets 304 140 - 400 K/uL   MPV 9.8 7.5 - 12.5 fL   Neutro Abs 2,550 1,500 - 7,800 cells/uL   Lymphs Abs 1,750 850 - 3,900 cells/uL   Monocytes Absolute 400 200 - 950 cells/uL   Eosinophils Absolute 250 15 - 500 cells/uL   Basophils Absolute 50 0 - 200 cells/uL   Neutrophils Relative % 51 %   Lymphocytes Relative 35 %   Monocytes Relative 8 %   Eosinophils Relative 5 %   Basophils Relative 1 %   Smear Review Criteria for review not met   COMPLETE METABOLIC PANEL WITH GFR  Result Value Ref Range   Sodium 139 135 - 146 mmol/L   Potassium 4.4 3.5 - 5.3 mmol/L   Chloride 102 98 - 110 mmol/L   CO2 27 20 - 31 mmol/L   Glucose, Bld 84 65 - 99 mg/dL   BUN 8 7 - 25 mg/dL   Creat 0.69 0.50 - 1.10 mg/dL   Total Bilirubin 0.3 0.2 - 1.2 mg/dL   Alkaline Phosphatase 91 33 - 115 U/L   AST 19 10 - 35 U/L   ALT 15 6 - 29 U/L   Total Protein 6.9 6.1 - 8.1 g/dL   Albumin 4.0 3.6 - 5.1 g/dL   Calcium 9.1 8.6 - 10.2 mg/dL   GFR, Est African American >89 >=60 mL/min   GFR, Est Non African American >89 >=60 mL/min  TSH  Result Value Ref Range   TSH 1.62 mIU/L  ANA,IFA RA Diag Pnl w/rflx Tit/Patn  Result Value Ref Range   Rhuematoid fact SerPl-aCnc <14 <14 IU/mL   Anit Nuclear Antibody(ANA) NEG NEGATIVE   Cyclic Citrullin Peptide Ab <16 Units      Assessment & Plan:   Problem List Items Addressed This Visit    None    Visit Diagnoses    Chronic vaginitis    -  Primary   Relevant Orders   WET PREP BY MOLECULAR PROBE (Completed)   Breast cancer screening       Relevant Orders   MM Digital Screening   Preventative health care       Relevant Orders   Lipid panel (Completed)   CBC with Differential/Platelet (Completed)   COMPLETE METABOLIC PANEL WITH GFR (Completed)   TSH (Completed)   Abnormal bruising       Other fatigue       Relevant  Orders   ANA,IFA RA Diag Pnl  w/rflx Tit/Patn (Completed)       Follow up plan: Return in about 4 weeks (around 04/17/2016) for complete physical.  An after-visit summary was printed and given to the patient at [place].  Please see the patient instructions which may contain other information and recommendations beyond what is mentioned above in the assessment and plan.  Meds ordered this encounter  Medications  . amphetamine-dextroamphetamine (ADDERALL) 7.5 MG tablet    Sig: Take 7.5 mg by mouth as needed.  . meloxicam (MOBIC) 7.5 MG tablet    Sig: Take 7.5 mg by mouth daily.  . metroNIDAZOLE (FLAGYL) 500 MG tablet    Sig: Take 1 tablet (500 mg total) by mouth 2 (two) times daily. No alcohol or cold medicines that contain alcohol while on this med    Dispense:  14 tablet    Refill:  0    Orders Placed This Encounter  Procedures  . WET PREP BY MOLECULAR PROBE  . MM Digital Screening  . Lipid panel  . CBC with Differential/Platelet  . COMPLETE METABOLIC PANEL WITH GFR  . TSH  . ANA,IFA RA Diag Pnl w/rflx Tit/Patn

## 2016-03-21 LAB — CBC WITH DIFFERENTIAL/PLATELET
BASOS ABS: 50 {cells}/uL (ref 0–200)
BASOS PCT: 1 %
EOS ABS: 250 {cells}/uL (ref 15–500)
Eosinophils Relative: 5 %
HCT: 38.1 % (ref 35.0–45.0)
HEMOGLOBIN: 12.3 g/dL (ref 11.7–15.5)
Lymphocytes Relative: 35 %
Lymphs Abs: 1750 cells/uL (ref 850–3900)
MCH: 29.6 pg (ref 27.0–33.0)
MCHC: 32.3 g/dL (ref 32.0–36.0)
MCV: 91.8 fL (ref 80.0–100.0)
MONOS PCT: 8 %
MPV: 9.8 fL (ref 7.5–12.5)
Monocytes Absolute: 400 cells/uL (ref 200–950)
Neutro Abs: 2550 cells/uL (ref 1500–7800)
Neutrophils Relative %: 51 %
Platelets: 304 10*3/uL (ref 140–400)
RBC: 4.15 MIL/uL (ref 3.80–5.10)
RDW: 13.7 % (ref 11.0–15.0)
WBC: 5 10*3/uL (ref 3.8–10.8)

## 2016-03-21 LAB — TSH: TSH: 1.62 mIU/L

## 2016-03-21 LAB — WET PREP BY MOLECULAR PROBE
Candida species: NOT DETECTED
Gardnerella vaginalis: NOT DETECTED
TRICHOMONAS VAG: NOT DETECTED

## 2016-03-23 LAB — ANA,IFA RA DIAG PNL W/RFLX TIT/PATN
Anti Nuclear Antibody(ANA): NEGATIVE
Cyclic Citrullin Peptide Ab: <16 Units

## 2016-04-30 ENCOUNTER — Encounter: Payer: BLUE CROSS/BLUE SHIELD | Admitting: Family Medicine

## 2016-08-07 ENCOUNTER — Other Ambulatory Visit: Payer: Self-pay | Admitting: Family Medicine

## 2016-08-07 ENCOUNTER — Ambulatory Visit: Payer: BLUE CROSS/BLUE SHIELD | Admitting: Family Medicine

## 2016-08-07 ENCOUNTER — Ambulatory Visit: Payer: BLUE CROSS/BLUE SHIELD

## 2016-08-07 ENCOUNTER — Encounter: Payer: Self-pay | Admitting: Family Medicine

## 2016-08-07 ENCOUNTER — Ambulatory Visit (INDEPENDENT_AMBULATORY_CARE_PROVIDER_SITE_OTHER): Payer: BLUE CROSS/BLUE SHIELD | Admitting: Family Medicine

## 2016-08-07 VITALS — BP 110/70 | HR 98 | Temp 98.2°F | Resp 16 | Ht 64.0 in | Wt 169.4 lb

## 2016-08-07 DIAGNOSIS — M25621 Stiffness of right elbow, not elsewhere classified: Secondary | ICD-10-CM

## 2016-08-07 DIAGNOSIS — M25632 Stiffness of left wrist, not elsewhere classified: Secondary | ICD-10-CM | POA: Diagnosis not present

## 2016-08-07 DIAGNOSIS — M25631 Stiffness of right wrist, not elsewhere classified: Secondary | ICD-10-CM | POA: Diagnosis not present

## 2016-08-07 DIAGNOSIS — F3289 Other specified depressive episodes: Secondary | ICD-10-CM | POA: Diagnosis not present

## 2016-08-07 DIAGNOSIS — M25421 Effusion, right elbow: Secondary | ICD-10-CM | POA: Diagnosis not present

## 2016-08-07 DIAGNOSIS — M25649 Stiffness of unspecified hand, not elsewhere classified: Secondary | ICD-10-CM | POA: Diagnosis not present

## 2016-08-07 DIAGNOSIS — R4583 Excessive crying of child, adolescent or adult: Secondary | ICD-10-CM

## 2016-08-07 DIAGNOSIS — Z5181 Encounter for therapeutic drug level monitoring: Secondary | ICD-10-CM

## 2016-08-07 DIAGNOSIS — M25622 Stiffness of left elbow, not elsewhere classified: Secondary | ICD-10-CM | POA: Diagnosis not present

## 2016-08-07 MED ORDER — DICLOFENAC SODIUM 75 MG PO TBEC: 75.0000 mg | DELAYED_RELEASE_TABLET | Freq: Two times a day (BID) | ORAL | 0 refills | Status: DC

## 2016-08-07 MED ORDER — CONJ ESTROG-MEDROXYPROGEST ACE 0.625-2.5 MG PO TABS: 1.0000 | ORAL_TABLET | Freq: Every day | ORAL | 0 refills | Status: DC

## 2016-08-07 NOTE — Progress Notes (Signed)
Name: Mindy Jenkins   MRN: [id number]    DOB: [birth year]   Date:08/07/2016       Progress Note  Subjective  Chief Complaint  Chief Complaint  Patient presents with  . Hormones  . Joint Swelling    joinjt pain mainly in morning, elbow, thumb and hands    HPI  Joint pain: BUE pain for 1.5 years - right elbow/wrist/thumb is the worst, but left also has symptoms. The pain is waking her at night. She has tried ibuprofen, aleve, and mobic with minimal relief. No redness; does feel like her right elbow is swollen by the end of the day -- applies icy-hot, ice, and heat with minimal relief.  Pt notes feeling very stiff in the morning - takes about 30 minutes to warm up and be able to use her hands.  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 She had rheumatoid labs done in 2016, but we will re-check today. - Takes 67m Advil in the morning, 6059mAdvil in the afternoon OR she'll take 1 Aleve in the morning and 1 at night. We will check kidney function today. - Maternal Grandmother has RA - Unknown if family members have had OA.  Suboxone Use: Patient just finished weaning down off of Suboxone two days ago [facility] She does have ativan Rx for possible withdrawal symptoms. Has follow up in 1 month.  Dr. ByLenise Arenalso prescribes Wellbutrin, but she would like to switch this Rx back to our office after her final follow up with Dr. ByLenise Arenaext month.  Patient is very tearful discussing suboxone and voices embarrassment about having needed it.  She was unwilling to discuss additional details regarding her suboxone use at our visit today.  She states that her husband is the only person that knows about this treatment and that he is extremely supportive.  Postmenopausal Issues: Emotionally, she has been having a hard time for several months - crying very often, in waves; says it feels like "I have permanent PMS". Gets very upset over small things.  No  longer having hot flashes; has been gaining weight and not able to help it. Also has constant vaginal irritation; is not currently sexually active, so she is unsure if she has dyspareunia; also having joint aches and pains as above.  We discussed Wellbutrin dosing vs changing to Effexor, and mutually agreed to stay on Wellbutrin at current dose until she is discharged from Dr. [facility]Lenise Arenan [facility] month, will add Prempro.  >2 years since her last menses, uterus still intact. No vaginal bleeding or discharge.  Denies SI/HI.  Patient Active Problem List   Diagnosis Date Noted  . Chronic right hip pain 09/25/2014  . Gall stones 07/14/2014  . Choledocholithiasis 06/28/2014  . Gastritis, acute 06/22/2014  . Obesity, Class I, BMI 30-34.9 06/20/2014  . Intractable chronic migraine without aura and without status migrainosus 04/18/2014  . History of fusion of cervical spine 09/15/2012    Social History  Substance Use Topics  . Smoking status: Never Smoker  . Smokeless tobacco: Never Used  . Alcohol use No    Current Outpatient Prescriptions:  .  amphetamine-dextroamphetamine (ADDERALL) 7.5 MG tablet, Take 7.5 mg by mouth as needed., Disp: , Rfl:  .  buPROPion (WELLBUTRIN SR) 150 MG 12 hr tablet, TAKE 1 TABLET BY MOUTH EVERY MORNING, Disp: 90 tablet, Rfl: 2 .  Multiple Vitamin (MULTIVITAMIN) capsule, Take 1 capsule by  mouth daily., Disp: , Rfl:  .  meloxicam (MOBIC) 7.5 MG tablet, Take 7.5 mg by mouth daily., Disp: , Rfl:   No Known Allergies  ROS  Ten systems reviewed and is negative except as mentioned in HPI  Objective  Vitals:   08/07/16 1059  BP: 110/70  Pulse: 98  Resp: 16  Temp: 98.2 F (36.8 C)  TempSrc: Oral  SpO2: 95%  Weight: 169 lb 6.4 oz (76.8 kg)  Height: '5\' 4"'  (1.626 m)   Body mass index is 29.08 kg/m.  Nursing Note and Vital Signs reviewed.  Physical Exam  Constitutional: Patient appears well-developed and well-nourished. Obese No distress.  HEENT:  head atraumatic, normocephalic Pulmonary/Chest: Effort normal and breath sounds clear. No respiratory distress or retractions. Abdominal: Soft and non-tender, bowel sounds present x4 quadrants. Psychiatric: Patient has a tearful/anxious mood and affect. behavior is normal. Judgment and thought content normal. She denies SI or HI today.   No results found for this or any previous visit (from the past 2160 hour(s)).  Assessment & Plan  1. Menopausal depression - estrogen, conjugated,-medroxyprogesterone (PREMPRO) 0.625-2.5 MG tablet; Take 1 tablet by mouth daily.  Dispense: 30 tablet; Refill: 0  2. Stiffness of thumb joint - Rheumatoid Factor - Antinuclear Antib (ANA) - Sed Rate (ESR) - diclofenac (VOLTAREN) 75 MG EC tablet; Take 1 tablet (75 mg total) by mouth 2 (two) times daily.  Dispense: 30 tablet; Refill: 0  3. Joint stiffness of both wrists - Rheumatoid Factor - Antinuclear Antib (ANA) - Sed Rate (ESR) - diclofenac (VOLTAREN) 75 MG EC tablet; Take 1 tablet (75 mg total) by mouth 2 (two) times daily.  Dispense: 30 tablet; Refill: 0  4. Joint stiffness of both elbows - Rheumatoid Factor - Antinuclear Antib (ANA) - Sed Rate (ESR) - diclofenac (VOLTAREN) 75 MG EC tablet; Take 1 tablet (75 mg total) by mouth 2 (two) times daily.  Dispense: 30 tablet; Refill: 0  5. Elbow swelling, right - Rheumatoid Factor - Antinuclear Antib (ANA) - Sed Rate (ESR) - diclofenac (VOLTAREN) 75 MG EC tablet; Take 1 tablet (75 mg total) by mouth 2 (two) times daily.  Dispense: 30 tablet; Refill: 0  We discussed morning routine to include gentle heat (heating pad, hot shower), massage, and stretching to loosen joints.  We will determine if referral is warranted based on labs. Advised her to continue her ADL's and the physical parts of her job as tolerated.  6. Medication monitoring encounter - COMPLETE METABOLIC PANEL WITH GFR  7. Excessive crying, adult - estrogen, conjugated,-medroxyprogesterone  (PREMPRO) 0.625-2.5 MG tablet; Take 1 tablet by mouth daily.  Dispense: 30 tablet; Refill: 0   Patient is out of town next week, so we will see her back in 1.5 - 2 weeks for close monitoring. -Red flags and when to present for emergency care or RTC including fever >101.109F, chest pain, shortness of breath, new/worsening/un-resolving symptoms, SI/HI or any worsening of psychiatric symptoms reviewed with patient at time of visit. Follow up and care instructions discussed and provided in AVS.

## 2016-08-07 NOTE — Progress Notes (Signed)
Seeing Mindy SmallEmily Jenkins

## 2016-08-08 LAB — COMPLETE METABOLIC PANEL WITH GFR
ALT: 14 U/L (ref 6–29)
AST: 18 U/L (ref 10–35)
Albumin: 4.1 g/dL (ref 3.6–5.1)
Alkaline Phosphatase: 86 U/L (ref 33–130)
BILIRUBIN TOTAL: 0.2 mg/dL (ref 0.2–1.2)
BUN: 11 mg/dL (ref 7–25)
CO2: 26 mmol/L (ref 20–31)
Calcium: 9.3 mg/dL (ref 8.6–10.4)
Chloride: 105 mmol/L (ref 98–110)
Creat: 0.8 mg/dL (ref 0.50–1.05)
GFR, EST NON AFRICAN AMERICAN: 86 mL/min (ref >=60)
GFR, Est African American: >89 mL/min (ref >=60)
GLUCOSE: 50 mg/dL — AB (ref 65–99)
Potassium: 4.4 mmol/L (ref 3.5–5.3)
SODIUM: 141 mmol/L (ref 135–146)
Total Protein: 6.8 g/dL (ref 6.1–8.1)

## 2016-08-08 LAB — SEDIMENTATION RATE: SED RATE: 7 mm/h (ref 0–20)

## 2016-08-10 LAB — ANA: ANA: NEGATIVE

## 2016-08-10 LAB — RHEUMATOID FACTOR

## 2016-08-19 ENCOUNTER — Ambulatory Visit: Payer: BLUE CROSS/BLUE SHIELD | Admitting: Family Medicine

## 2016-08-24 ENCOUNTER — Encounter: Payer: Self-pay | Admitting: Family Medicine

## 2016-08-24 ENCOUNTER — Ambulatory Visit (INDEPENDENT_AMBULATORY_CARE_PROVIDER_SITE_OTHER): Payer: BLUE CROSS/BLUE SHIELD | Admitting: Family Medicine

## 2016-08-24 VITALS — BP 124/76 | HR 78 | Temp 97.8°F | Resp 16 | Ht 64.0 in | Wt 170.3 lb

## 2016-08-24 DIAGNOSIS — F3289 Other specified depressive episodes: Secondary | ICD-10-CM | POA: Diagnosis not present

## 2016-08-24 DIAGNOSIS — R4583 Excessive crying of child, adolescent or adult: Secondary | ICD-10-CM | POA: Diagnosis not present

## 2016-08-24 DIAGNOSIS — N951 Menopausal and female climacteric states: Secondary | ICD-10-CM

## 2016-08-24 DIAGNOSIS — F1190 Opioid use, unspecified, uncomplicated: Secondary | ICD-10-CM

## 2016-08-24 DIAGNOSIS — F332 Major depressive disorder, recurrent severe without psychotic features: Secondary | ICD-10-CM | POA: Diagnosis not present

## 2016-08-24 DIAGNOSIS — G43719 Chronic migraine without aura, intractable, without status migrainosus: Secondary | ICD-10-CM

## 2016-08-24 DIAGNOSIS — M25421 Effusion, right elbow: Secondary | ICD-10-CM

## 2016-08-24 DIAGNOSIS — M25521 Pain in right elbow: Secondary | ICD-10-CM | POA: Diagnosis not present

## 2016-08-24 DIAGNOSIS — Z981 Arthrodesis status: Secondary | ICD-10-CM | POA: Diagnosis not present

## 2016-08-24 DIAGNOSIS — N761 Subacute and chronic vaginitis: Secondary | ICD-10-CM

## 2016-08-24 MED ORDER — METRONIDAZOLE 0.75 % EX GEL: 1.0000 "application " | Freq: Two times a day (BID) | CUTANEOUS | 0 refills | Status: DC

## 2016-08-24 MED ORDER — VENLAFAXINE HCL ER 37.5 MG PO CP24: 37.5000 mg | ORAL_CAPSULE | Freq: Every day | ORAL | 1 refills | Status: DC

## 2016-08-24 NOTE — Progress Notes (Addendum)
Name: Mindy Jenkins   MRN: [id number]    DOB: [birth year]   Date:08/24/2016       Progress Note  Subjective  Chief Complaint  Chief Complaint  Patient presents with  . Follow-up    HPI  Joint pain: BUE pain for 1.5 years - right elbow/wrist/thumb is the worst, but left also has symptoms. The pain is waking her at night. She has tried ibuprofen, aleve, mobic, and we gave voltaren at last visit with minimal to moderate relief. No redness; does feel like her right elbow is swollen by the end of the day -- applies icy-hot, ice, and heat with minimal relief.  Pt notes feeling very stiff in the morning - takes about 30 minutes to warm up and be able to use her hands.  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 She had rheumatoid labs at last visit and were negative.  We will refer to ortho for further evalation today. - Maternal Grandmother has RA - Unknown if family members have had OA.  Suboxone Use/Opiate Misuse History: Patient was weaning off of Suboxone with Dr[facility] Lenise [facility], would like referral to someone else because she wants a provider that will work with her to wean off instead of encouraging her to stay on it.  She was started at 23m - feels this was too high of a starting dose; she is currently on 1.575monce daily.  She does have ativan Rx for possible withdrawal symptoms but has not been using it. Has follow up on 09/03/2016 with Dr. ByLenise Arenabut would like to switch providers before then.  Dr. ByLenise Arenalso prescribes Wellbutrin, but she would like to switch this Rx back to our office.  She was addicted to [employer] had chronic migraines and chronic neck pain.  She states that her husband is the only person that knows about this treatment and that he is extremely supportive.  She expresses great concern that her personal medical record will have this issue on it, explained that by law, I must document what we discuss including opiate misuse  history and suboxone use. I provided the patient privacy telephone number for [facility] advised she call to discuss her privacy concerns and determine if making her chart more private is an option that she would like to use.  Postmenopausal Issues:  Emotionally, she has been having a hard time for several months - crying very often, in waves; says it feels like "I have permanent PMS". Gets very upset over small things.  Having some hot flashes; has been gaining weight and not able to help it. Took 14 days of prempro, got a migraine - used to get migraines when she was pre-menopausal - suspects this was hormonally induced. She also notes that she didn't notice a difference on the prempro when she took it for those first 14 days, so she stopped it.  We discussed adding Effexor, and agreed to stay on Wellbutrin at current dose for the time being.   Depression Issues: In-depth discussion regarding her current psychiatric state and care was had during this visit, and it is agreed that she would benefit from psychiatric care for her depressive symptoms - we will place this referral today.  She states [facility], but only Suboxone care. PHQ-9 - 21.  Denies SI/HI.   Vaginal Irritation: Also has constant vaginal irritation which she took flagyl PO for -  it went away, but yeast infection occurred (this was treated too); says the irritation is back now with fishy smell, white/gray moderate amount. Is not currently sexually active, so she is unsure if she has dyspareunia.  We will treat today for BV with Metrogel. No abdominal pain, fevers/chills, dysuria or frequency, no vaginal bleeding.   Patient Active Problem List   Diagnosis Date Noted  . Chronic right hip pain 09/25/2014  . Gall stones 07/14/2014  . Choledocholithiasis 06/28/2014  . Gastritis, acute 06/22/2014  . Obesity, Class I, BMI 30-34.9 06/20/2014  . Intractable chronic migraine  without aura and without status migrainosus 04/18/2014  . History of fusion of cervical spine 09/15/2012    Past Surgical History:  Procedure Laterality Date  . BOTOX INJECTION N/A    Dr. Jannifer Franklin performs these throughout her scalp. Approx 25 injections.  . CERVICAL FUSION  05/06/2012  . CHOLECYSTECTOMY N/A 07/25/2014   Procedure: LAPAROSCOPIC CHOLECYSTECTOMY WITH INTRAOPERATIVE CHOLANGIOGRAM;  Surgeon: Robert Bellow, MD;  Location: [facility] ORS;  Service: General;  Laterality: N/A;    Family History  Problem Relation Age of Onset  . Heart attack Mother   . Cervical cancer Mother   . Aneurysm Mother   . Epilepsy Child        [place]  . Heart disease Paternal Grandmother     Social History   Social History  . Marital status: Married    Spouse name: N/A  . Number of children: 2  . Years of education: N/A   Occupational History  . self employed     Licensed conveyancer   Social History Main Topics  . Smoking status: Never Smoker  . Smokeless tobacco: Never Used  . Alcohol use No  . Drug use: No  . Sexual activity: Yes    Partners: Male   Other Topics Concern  . Not on file   Social History Narrative  . No narrative on file     Current Outpatient Prescriptions:  .  amphetamine-dextroamphetamine (ADDERALL) 7.5 MG tablet, Take 7.5 mg by mouth as needed., Disp: , Rfl:  .  buPROPion (WELLBUTRIN SR) 150 MG 12 hr tablet, TAKE 1 TABLET BY MOUTH EVERY MORNING, Disp: 90 tablet, Rfl: 2 .  calcium gluconate 1 g, magnesium sulfate 1 g in sodium chloride 0.9 % 100 mL, calcium, Disp: , Rfl:  .  diclofenac (VOLTAREN) 75 MG EC tablet, Take 1 tablet (75 mg total) by mouth 2 (two) times daily., Disp: 30 tablet, Rfl: 0 .  estrogen, conjugated,-medroxyprogesterone (PREMPRO) 0.625-2.5 MG tablet, Take 1 tablet by mouth daily., Disp: 30 tablet, Rfl: 0 .  LORazepam (ATIVAN) 1 MG tablet, TK 1 T PO BID PRA, Disp: , Rfl: 0 .  Multiple Vitamin (MULTIVITAMIN) capsule, Take 1 capsule by mouth daily.,  Disp: , Rfl:  .  SUBOXONE 2-0.5 MG FILM, Take 1.5 mg by mouth daily., Disp: , Rfl:   No Known Allergies   ROS  Constitutional: Negative for fever or weight change.  Respiratory: Negative for cough and shortness of breath.   Cardiovascular: Negative for chest pain or palpitations.  Gastrointestinal: Negative for abdominal pain, no bowel changes.  Musculoskeletal: Negative for gait problem. See HPI.  Skin: Negative for rash.  Neurological: Negative for dizziness or headache.  No other specific complaints in a complete review of systems (except as listed in HPI above).  Objective  Vitals:   08/24/16 0906  BP: 124/76  Pulse: 78  Resp: 16  Temp: 97.8 F (36.6 C)  TempSrc:  Oral  SpO2: 95%  Weight: 170 lb 4.8 oz (77.2 kg)  Height: '5\' 4"'  (1.626 m)   Body mass index is 29.23 kg/m.  Physical Exam Constitutional: Patient appears well-developed and well-nourished. No distress.  HENT: Head: Normocephalic and atraumatic. Ears: B TMs ok, no erythema or effusion; Nose: Nose normal. Mouth/Throat: Oropharynx is clear and moist. No oropharyngeal exudate.  Eyes: Conjunctivae and EOM are normal. Pupils are equal, round, and reactive to light. No scleral icterus.  Neck: Normal range of motion. Neck supple. No JVD present. No thyromegaly present.  Cardiovascular: Normal rate, regular rhythm and normal heart sounds.  No murmur heard. No BLE edema. Pulmonary/Chest: Effort normal and breath sounds normal. No respiratory distress. Abdominal: Soft. Bowel sounds are normal, no distension. There is no tenderness. no masses Musculoskeletal: Normal range of motion, no joint effusions. No gross deformities. RIGHT elbow exhibits a +tinel's along the lateral epicondyle, no crepitus. Neurological: she is alert and oriented to person, place, and time. No cranial nerve deficit. Coordination, balance, strength, speech and gait are normal.  Skin: Skin is warm and dry. No rash noted. No erythema.  Psychiatric:  Patient has a normal depressed and anxious mood and affect. behavior is appropriate for topics discussed. Judgment and thought content normal.  She denies SI/HI   Recent Results (from the past 2160 hour(s))  Rheumatoid Factor     Status: None   Collection Time: 08/07/16 11:49 AM  Result Value Ref Range   Rhuematoid fact SerPl-aCnc <14 <14 IU/mL  Antinuclear Antib (ANA)     Status: None   Collection Time: 08/07/16 11:49 AM  Result Value Ref Range   Anit Nuclear Antibody(ANA) NEG NEGATIVE  Sed Rate (ESR)     Status: None   Collection Time: 08/07/16 11:49 AM  Result Value Ref Range   Sed Rate 7 0 - 20 mm/hr  COMPLETE METABOLIC PANEL WITH GFR     Status: Abnormal   Collection Time: 08/07/16 11:49 AM  Result Value Ref Range   Sodium 141 135 - 146 mmol/L   Potassium 4.4 3.5 - 5.3 mmol/L   Chloride 105 98 - 110 mmol/L   CO2 26 20 - 31 mmol/L   Glucose, Bld 50 (L) 65 - 99 mg/dL   BUN 11 7 - 25 mg/dL   Creat 0.80 0.50 - 1.05 mg/dL    Comment:   For patients > or = 50 years of age: The upper reference limit for Creatinine is approximately 13% higher for people identified as African-American.      Total Bilirubin 0.2 0.2 - 1.2 mg/dL   Alkaline Phosphatase 86 33 - 130 U/L   AST 18 10 - 35 U/L   ALT 14 6 - 29 U/L   Total Protein 6.8 6.1 - 8.1 g/dL   Albumin 4.1 3.6 - 5.1 g/dL   Calcium 9.3 8.6 - 10.4 mg/dL   GFR, Est African American >89 >=60 mL/min   GFR, Est Non African American 86 >=60 mL/min    PHQ2/9: Depression screen [facility] 2/9 08/24/2016 03/20/2016 06/20/2014  Decreased Interest 3 0 0  Down, Depressed, Hopeless 3 0 1  PHQ - 2 Score 6 0 1  Altered sleeping 3 - -  Tired, decreased energy 3 - -  Change in appetite 3 - -  Feeling bad or failure about yourself  3 - -  Trouble concentrating 3 - -  Moving slowly or fidgety/restless 0 - -  Suicidal thoughts 0 - -  PHQ-9 Score 21 - -  Difficult doing work/chores Extremely dIfficult - -   Fall Risk: Fall Risk  03/20/2016  Falls  in the past year? Yes  Number falls in past yr: 1  Injury with Fall? Yes  Comment wrist injury left   Assessment & Plan  1. Severe episode of recurrent major depressive disorder, without psychotic features (HCC) - venlafaxine XR (EFFEXOR-XR) 37.5 MG 24 hr capsule; Take 1 capsule (37.5 mg total) by mouth daily with breakfast.  Dispense: 30 capsule; Refill: 1 - Ambulatory referral to Psychiatry  2. Intractable chronic migraine without aura and without status migrainosus Stop prempro  3. History of fusion of cervical spine - Ambulatory referral to Orthopedic Surgery  4. Right elbow pain - Ambulatory referral to Orthopedic Surgery  5. Elbow swelling, right - Ambulatory referral to Orthopedic Surgery  6. Chronic vaginitis - metroNIDAZOLE (METROGEL) 0.75 % gel; Apply 1 application topically 2 (two) times daily. Apply vaginally up to twice daily x5 days.  Dispense: 45 g; Refill: 0  7. Menopausal depression - venlafaxine XR (EFFEXOR-XR) 37.5 MG 24 hr capsule; Take 1 capsule (37.5 mg total) by mouth daily with breakfast.  Dispense: 30 capsule; Refill: 1 - Ambulatory referral to Psychiatry  8. Excessive crying, adult - venlafaxine XR (EFFEXOR-XR) 37.5 MG 24 hr capsule; Take 1 capsule (37.5 mg total) by mouth daily with breakfast.  Dispense: 30 capsule; Refill: 1 - Ambulatory referral to Psychiatry  9. Hot flashes due to menopause - venlafaxine XR (EFFEXOR-XR) 37.5 MG 24 hr capsule; Take 1 capsule (37.5 mg total) by mouth daily with breakfast.  Dispense: 30 capsule; Refill: 1  10. Opiate misuse - Patient will consider releasing her information for our office to call [facility] to gather information regarding her care. I will research other local facilities that may provide suboxone therapy - Dr. Talbert Forest Corrington with [place], to see if they are accepting new patients, and relay this information to the patient.  She is reassured  that we will not discuss her personal case with these providers nor will we contact [facility] unless she has signed a release of information for these individual clinics.  I have reviewed this encounter including the documentation in this note and/or discussed this patient with the Johney Maine, FNP, NP-C. I am certifying that I agree with the content of this note as supervising physician.  Steele Sizer, MD [facility] 08/26/2016, 4:49 PM

## 2016-08-25 ENCOUNTER — Telehealth: Payer: Self-pay | Admitting: Family Medicine

## 2016-08-25 NOTE — Telephone Encounter (Signed)
Spoke with patient via telephone - [facility] is not accepting new patients, but she may try [facility] at [phone] if she still wants to change providers.  We discussed her privacy concerns regarding suboxone use, and I advised that by law, I must document what we discuss and she is understanding of this. I provided the patient with the patient privacy services telephone number for [place] at [phone] for her to inquire about making her chart more private.

## 2016-11-07 IMAGING — CR DG CHOLANGIOGRAM OPERATIVE
1 series · 10 of 10 positions shown · non-contrast
Comparison: Ultrasound June 28, 2014.

CLINICAL DATA: Gallstones.

EXAM:
INTRAOPERATIVE CHOLANGIOGRAM
TECHNIQUE: Cholangiographic images from the C-arm fluoroscopic device were
submitted for interpretation post-operatively. Please see the
procedural report for the amount of contrast and the fluoroscopy
time utilized.

[Series 3: cont. · 10 of 31 frames shown]
[frame 1/31]
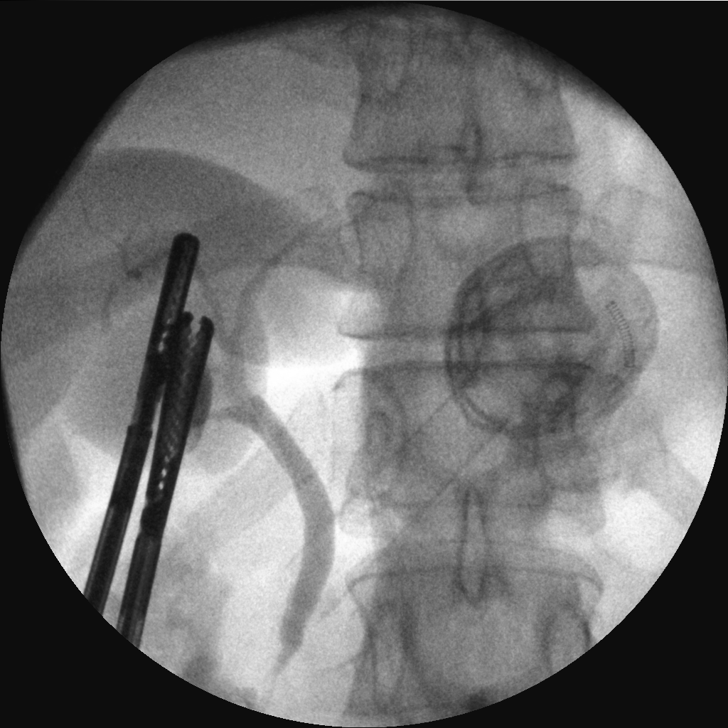
[frame 4/31]
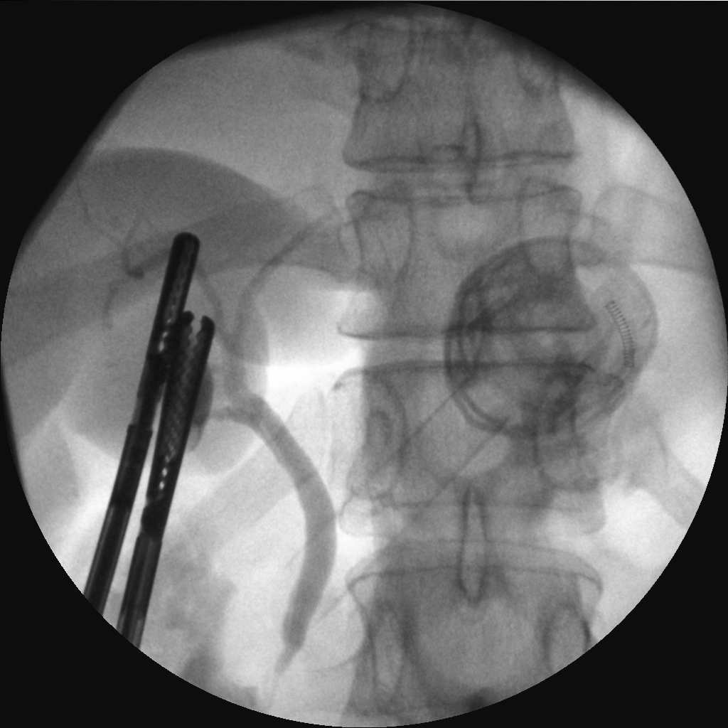
[frame 7/31]
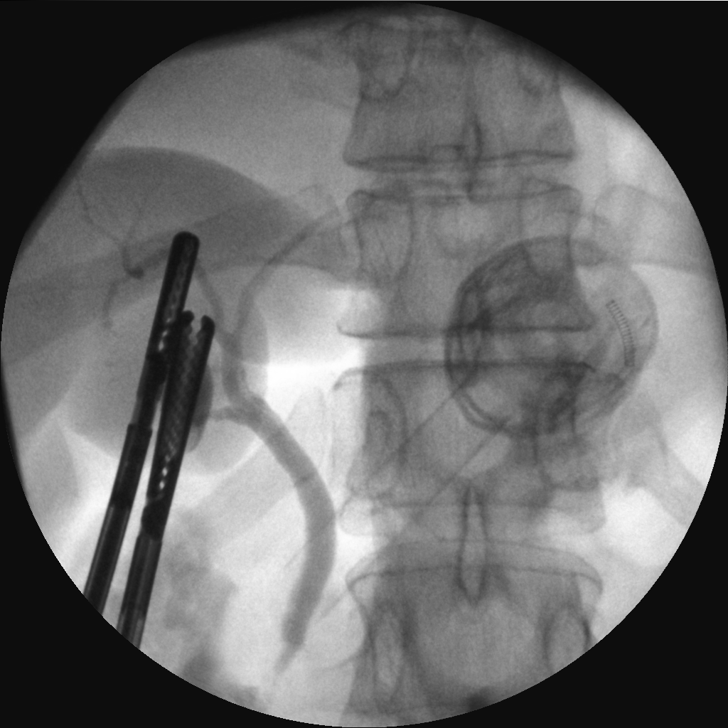
[frame 11/31]
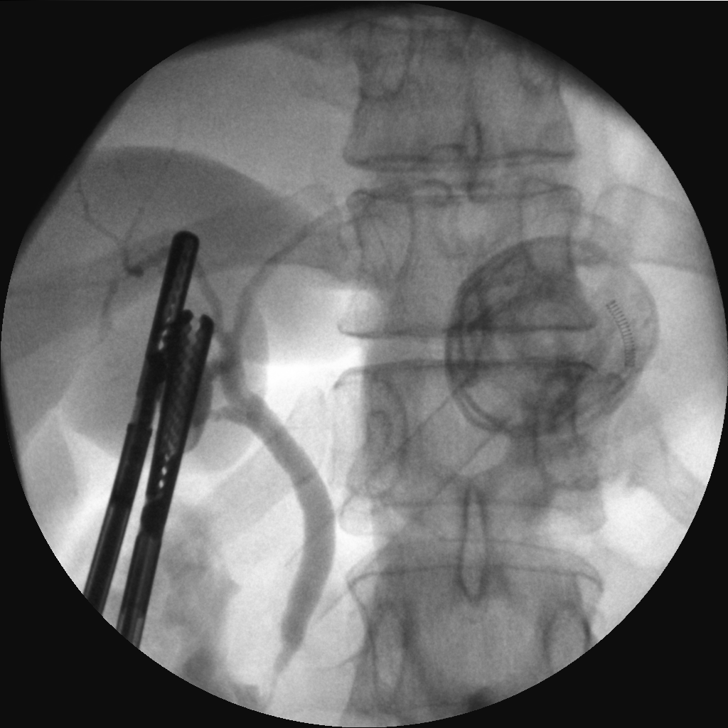
[frame 14/31]
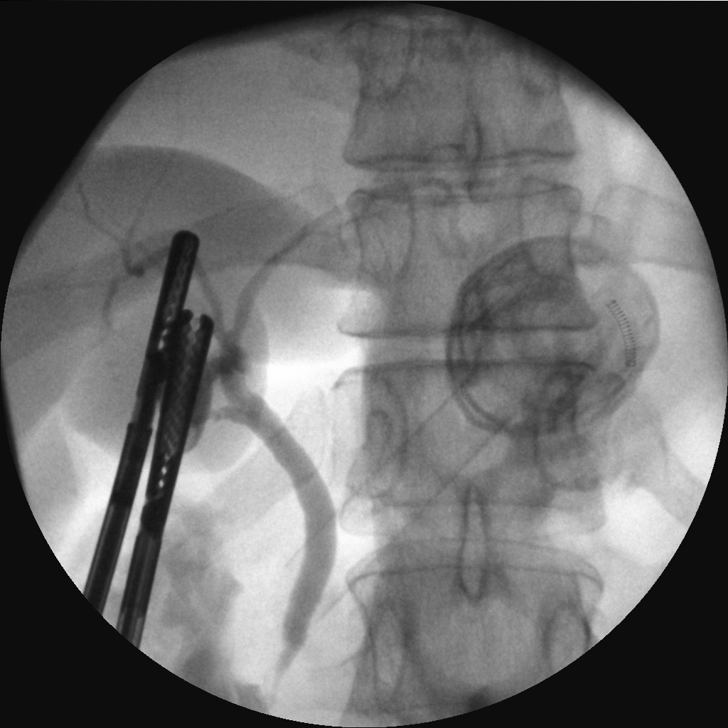
[frame 17/31]
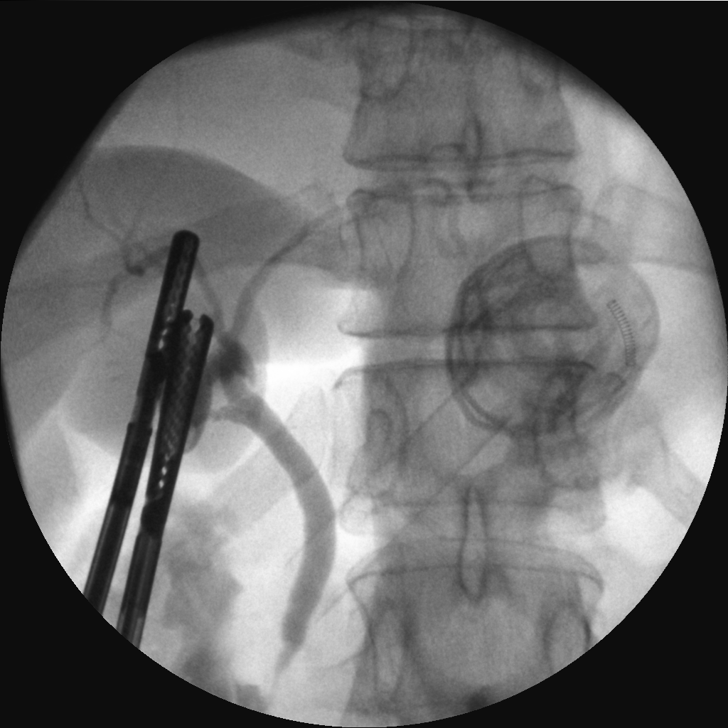
[frame 21/31]
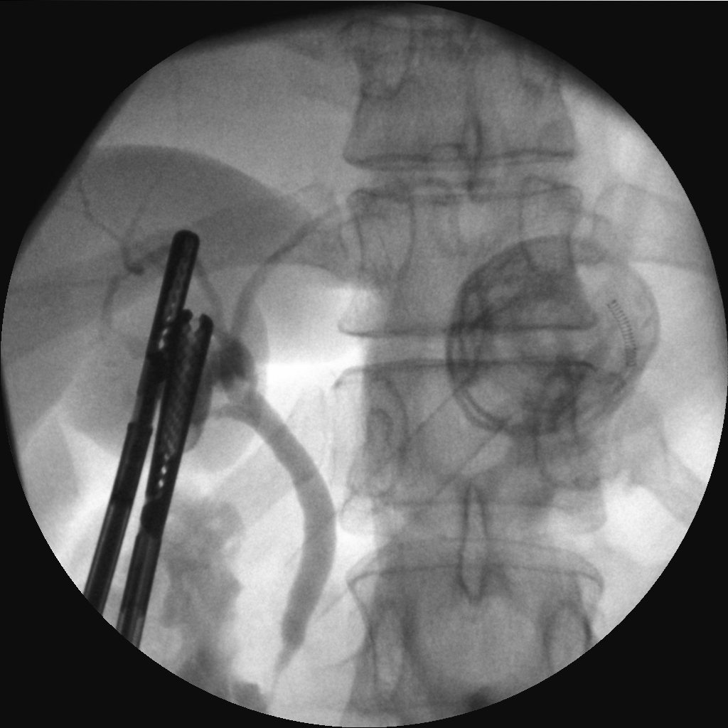
[frame 24/31]
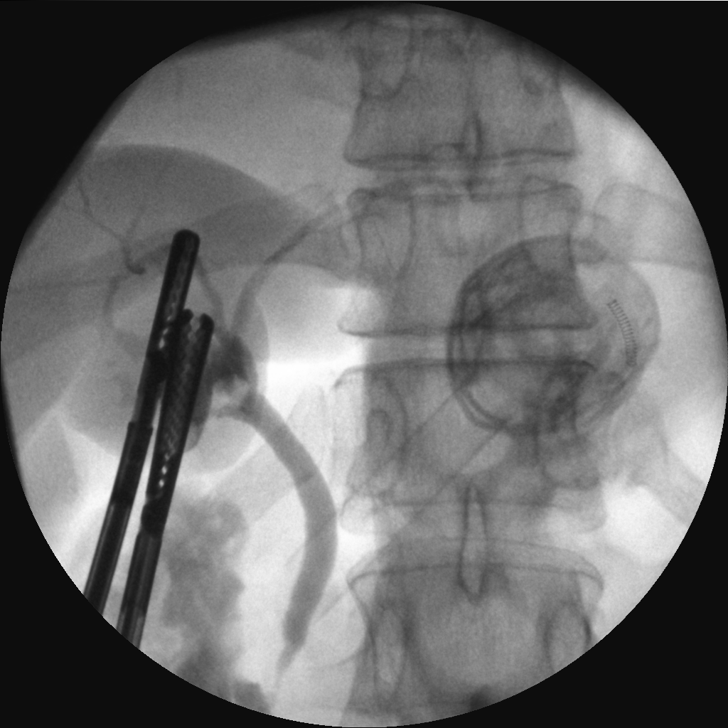
[frame 27/31]
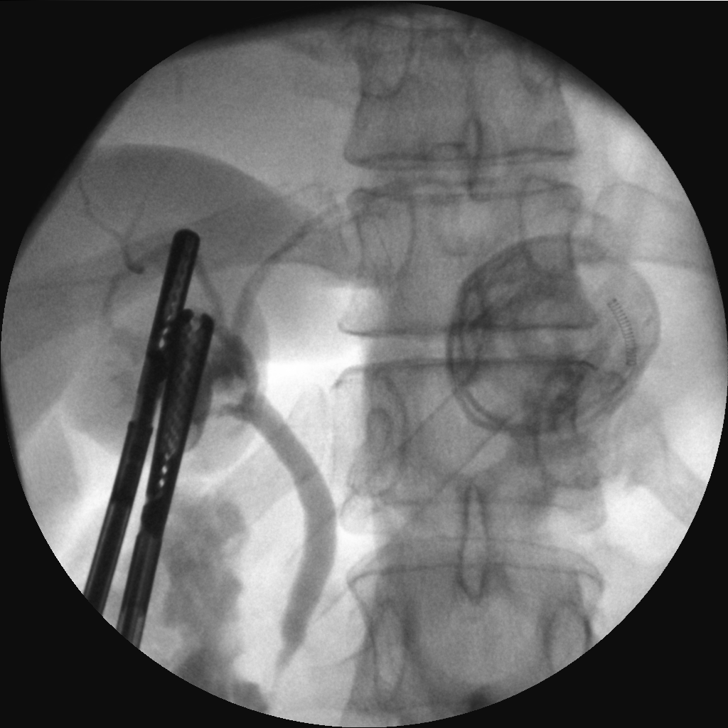
[frame 31/31]
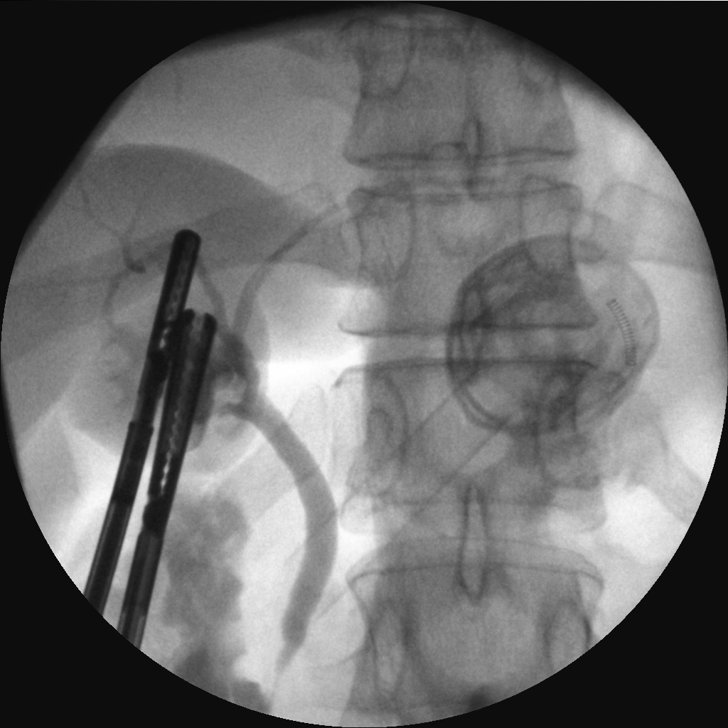

[10 of 10 positions shown; findings below may reference images not displayed]

FINDINGS: Contrast was injected into cannulated cystic duct remnant normal
contrast filling of common bile duct and intrahepatic ducts is
noted. No filling defect is seen to suggest residual stone.
Antegrade flow into duodenum is noted.
IMPRESSION: No common bile duct stones are noted.

## 2016-12-17 ENCOUNTER — Other Ambulatory Visit: Payer: Self-pay | Admitting: Family Medicine

## 2016-12-17 DIAGNOSIS — N761 Subacute and chronic vaginitis: Secondary | ICD-10-CM

## 2018-07-07 ENCOUNTER — Encounter: Payer: Self-pay | Admitting: Nurse Practitioner

## 2018-07-07 ENCOUNTER — Ambulatory Visit (INDEPENDENT_AMBULATORY_CARE_PROVIDER_SITE_OTHER): Payer: Managed Care, Other (non HMO) | Admitting: Nurse Practitioner

## 2018-07-07 DIAGNOSIS — F3341 Major depressive disorder, recurrent, in partial remission: Secondary | ICD-10-CM | POA: Diagnosis not present

## 2018-07-07 MED ORDER — BUPROPION HCL ER (SR) 150 MG PO TB12
150.0000 mg | ORAL_TABLET | Freq: Every morning | ORAL | 3 refills | Status: DC
Start: 2018-07-07 — End: 2019-07-03

## 2018-07-07 NOTE — Progress Notes (Signed)
Virtual Visit via Video Note  I connected with Mindy Jenkins on 07/07/18 at  9:00 AM EDT by a video enabled telemedicine application and verified that I am speaking with the correct person using two identifiers.   Staff discussed the limitations of evaluation and management by telemedicine and the availability of in person appointments. The patient expressed understanding and agreed to proceed.  Patient location: home  My location: home office Other people present: none HPI  Has been on wellbutrin for years states initially started for migraines and mood. States since she has been menopausal her migraines have resolved. She thought she could go off of her Wellbutrin and over the past few weeks has noticed she is more frustrated and cant sleep, doesn't notice specific depressive symptoms, but would like to go back on the wellbutrin.   Has been getting it from cognitive behavioral therapy- has been closed, no longer going there- last visit was last year.  [facility] 07/07/2018 08/24/2016 03/20/2016 06/20/2014  Decreased Interest 0 3 0 0  Down, Depressed, Hopeless 0 3 0 1  PHQ - 2 Score 0 6 0 1  Altered sleeping 0 3 - -  Tired, decreased energy 0 3 - -  Change in appetite 0 3 - -  Feeling bad or failure about yourself  0 3 - -  Trouble concentrating 0 3 - -  Moving slowly or fidgety/restless 0 0 - -  Suicidal thoughts 0 0 - -  PHQ-9 Score 0 21 - -  Difficult doing work/chores Not difficult at all Extremely dIfficult - -    PHQ reviewed. Negative  Patient Active Problem List   Diagnosis Date Noted  . Radial styloid tenosynovitis 02/17/2016  . Trigger finger 02/17/2016  . Chronic right hip pain 09/25/2014  . Gall stones 07/14/2014  . Choledocholithiasis 06/28/2014  . Gastritis, acute 06/22/2014  . Obesity, Class I, BMI 30-34.9 06/20/2014  . Intractable chronic migraine without aura and without status migrainosus 04/18/2014  . History of fusion of cervical  spine 09/15/2012    Past Medical History:  Diagnosis Date  . Anemia    H/O  . Migraine without aura, with intractable migraine, so stated, without mention of status migrainosus     Past Surgical History:  Procedure Laterality Date  . BOTOX INJECTION N/A    Dr. Anne HahnWillis performs these throughout her scalp. Approx 25 injections.  . CERVICAL FUSION  05/06/2012  . CHOLECYSTECTOMY N/A 07/25/2014   Procedure: LAPAROSCOPIC CHOLECYSTECTOMY WITH INTRAOPERATIVE CHOLANGIOGRAM;  Surgeon: Earline MayotteJeffrey W Byrnett, MD;  Location: [facility] ORS;  Service: General;  Laterality: N/A;    Social History   Tobacco Use  . Smoking status: Never Smoker  . Smokeless tobacco: Never Used  Substance Use Topics  . Alcohol use: No    Alcohol/week: 0.0 standard drinks     Current Outpatient Medications:  .  buPROPion (WELLBUTRIN SR) 150 MG 12 hr tablet, TAKE 1 TABLET BY MOUTH EVERY MORNING (Patient taking differently: Take 75 mg by mouth daily. ), Disp: 90 tablet, Rfl: 2 .  amphetamine-dextroamphetamine (ADDERALL) 7.5 MG tablet, Take 7.5 mg by mouth as needed., Disp: , Rfl:  .  calcium gluconate 1 g, magnesium sulfate 1 g in sodium chloride 0.9 % 100 mL, calcium, Disp: , Rfl:  .  diclofenac (VOLTAREN) 75 MG EC tablet, Take 1 tablet (75 mg total) by mouth 2 (two) times daily. (Patient not taking: Reported on 07/07/2018), Disp: 30 tablet, Rfl: 0 .  LORazepam (ATIVAN) 1 MG  tablet, TK 1 T PO BID PRA, Disp: , Rfl: 0 .  metroNIDAZOLE (METROGEL) 0.75 % gel, Apply 1 application topically 2 (two) times daily. Apply vaginally up to twice daily x5 days. (Patient not taking: Reported on 07/07/2018), Disp: 45 g, Rfl: 0 .  Multiple Vitamin (MULTIVITAMIN) capsule, Take 1 capsule by mouth daily., Disp: , Rfl:  .  SUBOXONE 2-0.5 MG FILM, Take 1.5 mg by mouth daily., Disp: , Rfl:  .  venlafaxine XR (EFFEXOR-XR) 37.5 MG 24 hr capsule, Take 1 capsule (37.5 mg total) by mouth daily with breakfast. (Patient not taking: Reported on 07/07/2018), Disp:  30 capsule, Rfl: 1  No Known Allergies  ROS   No other specific complaints in a complete review of systems (except as listed in HPI above).  Objective  There were no vitals filed for this visit.   There is no height or weight on file to calculate BMI.  Nursing Note and Vital Signs reviewed.  Physical Exam  Constitutional: Patient appears well-developed and well-nourished. No distress.  HENT: Head: Normocephalic and atraumatic. Pulmonary/Chest: Effort normal  Neurological: alert and oriented, speech normal.  Skin: No rash noted. No erythema.  Psychiatric: Patient has a normal mood and affect. behavior is normal. Judgment and thought content normal.    Assessment & Plan  1. Recurrent major depressive disorder, in partial remission (HCC) - buPROPion (WELLBUTRIN SR) 150 MG 12 hr tablet; Take 1 tablet (150 mg total) by mouth every morning.  Dispense: 90 tablet; Refill: 3   Follow Up Instructions:    I discussed the assessment and treatment plan with the patient. The patient was provided an opportunity to ask questions and all were answered. The patient agreed with the plan and demonstrated an understanding of the instructions.   The patient was advised to call back or seek an in-person evaluation if the symptoms worsen or if the condition fails to improve as anticipated.  I provided 12 minutes of non-face-to-face time during this encounter.   Fredderick Severance, NP

## 2019-07-03 ENCOUNTER — Other Ambulatory Visit: Payer: Self-pay

## 2019-07-03 DIAGNOSIS — F3341 Major depressive disorder, recurrent, in partial remission: Secondary | ICD-10-CM

## 2019-07-03 MED ORDER — BUPROPION HCL ER (SR) 150 MG PO TB12: 150.0000 mg | ORAL_TABLET | Freq: Every morning | ORAL | 3 refills | Status: DC

## 2019-07-04 ENCOUNTER — Other Ambulatory Visit: Payer: Self-pay

## 2019-07-04 ENCOUNTER — Encounter: Payer: Self-pay | Admitting: Family Medicine

## 2019-07-04 ENCOUNTER — Other Ambulatory Visit: Payer: Self-pay | Admitting: Family Medicine

## 2019-07-04 ENCOUNTER — Ambulatory Visit: Payer: Managed Care, Other (non HMO) | Admitting: Family Medicine

## 2019-07-04 VITALS — BP 118/80 | HR 92 | Temp 97.4°F | Resp 16 | Ht 64.0 in | Wt 179.2 lb

## 2019-07-04 DIAGNOSIS — F3341 Major depressive disorder, recurrent, in partial remission: Secondary | ICD-10-CM

## 2019-07-04 DIAGNOSIS — F5105 Insomnia due to other mental disorder: Secondary | ICD-10-CM | POA: Insufficient documentation

## 2019-07-04 DIAGNOSIS — F1121 Opioid dependence, in remission: Secondary | ICD-10-CM

## 2019-07-04 DIAGNOSIS — Z981 Arthrodesis status: Secondary | ICD-10-CM | POA: Diagnosis not present

## 2019-07-04 DIAGNOSIS — G8929 Other chronic pain: Secondary | ICD-10-CM

## 2019-07-04 DIAGNOSIS — Z8269 Family history of other diseases of the musculoskeletal system and connective tissue: Secondary | ICD-10-CM | POA: Diagnosis not present

## 2019-07-04 DIAGNOSIS — F99 Mental disorder, not otherwise specified: Secondary | ICD-10-CM

## 2019-07-04 DIAGNOSIS — M549 Dorsalgia, unspecified: Secondary | ICD-10-CM

## 2019-07-04 DIAGNOSIS — F419 Anxiety disorder, unspecified: Secondary | ICD-10-CM

## 2019-07-04 DIAGNOSIS — F909 Attention-deficit hyperactivity disorder, unspecified type: Secondary | ICD-10-CM

## 2019-07-04 DIAGNOSIS — F331 Major depressive disorder, recurrent, moderate: Secondary | ICD-10-CM

## 2019-07-04 DIAGNOSIS — R1010 Upper abdominal pain, unspecified: Secondary | ICD-10-CM

## 2019-07-04 DIAGNOSIS — Z9889 Other specified postprocedural states: Secondary | ICD-10-CM | POA: Insufficient documentation

## 2019-07-04 DIAGNOSIS — Z9049 Acquired absence of other specified parts of digestive tract: Secondary | ICD-10-CM

## 2019-07-04 HISTORY — DX: Major depressive disorder, recurrent, moderate: F33.1

## 2019-07-04 HISTORY — DX: Anxiety disorder, unspecified: F41.9

## 2019-07-04 MED ORDER — PANTOPRAZOLE SODIUM 20 MG PO TBEC: 20.0000 mg | DELAYED_RELEASE_TABLET | ORAL | 1 refills | Status: DC

## 2019-07-04 MED ORDER — ESCITALOPRAM OXALATE 10 MG PO TABS: 10.0000 mg | ORAL_TABLET | Freq: Every day | ORAL | 1 refills | Status: DC

## 2019-07-04 NOTE — Progress Notes (Signed)
Order to correctly today during office visit therefore not completed today prior to patient leaving  I did send her a message asking her to hold the pantoprazole and, and complete the breath test at her convenience  1. Pain of upper abdomen  - H. pylori breath test

## 2019-07-04 NOTE — Patient Instructions (Signed)
Try adding lexapro to your wellbutrin  Find a therapist  6 week follow up  Start protonix in the morning for the next 2-4 weeks  You should hear from the rheumatologist for an appointment in the next 2 weeks but it can take up to 30 days.

## 2019-07-04 NOTE — Progress Notes (Signed)
Name: Mindy Jenkins   MRN: [id number]    DOB: [birth year]   Date:07/04/2019       Progress Note  Chief Complaint  Patient presents with  . Consult    had gallbladder surgery 3 years ago, still having pain   . Hormone Therapy  . 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 female, presents to clinic for multiple acute complaints, her PCP retired from the practice over a year ago and she is new to me -last appointment was about 1 year ago  Son just diagnosed with ankylosing spondylosis and was positive for HLA-B27, pt has suffered with back pain to lumbar spine and she has history of cervical spinal surgery -notes that she was very young for surgery she thinks around 54 year old for cervical spine surgery. She is concerned that she may also have +  HLA-B27 and or ankylosing spondylitis-and she request to be referred to a specialist for evaluation or to be tested here today  Patient denies any past medical history concerning for or consistent with uveitis, psoriasis, and inflammatory bowel diseases She denies any other arthralgias or myalgias she notes back pain is chronic but mild she does have low SI joint pain that is constantly achy and only rarely will radiate to her right leg but she otherwise denies any frequent flares or consistent joint pain or muscle pain  Patient is also concerned about several years of right upper quadrant and epigastric sharp intermittent pain. This has been ongoing since she had cholecystectomy in 2016. She will occasionally have more pain when she eats greasy foods so she has been careful with her diet, she has not had any nausea vomiting diarrhea constipation. Pain does seem to come and go randomly without much temporal relationship to eating, does not seem to be affected by positional changes or worse at night when laying down. She denies any reflux indigestion dysphagia, bloating. She states  that she can press very hard up under her right ribs and reproduce the pain. She has not noticed any worsening pain with deep inspiration Patient was diagnosed about 5 years ago with choledocholithiasis by her PCP and referred to general surgery, Dr. Fleet Contras did her cholecystectomy at [facility] with intraoperative cholangiogram.   She is concerned something is wrong and she feels as if she is let herself fall apart-she is afraid that something is stuck in there or something is blocking her bile duct. The pain again is sharp intermittent last sometimes only a few seconds or few minutes, nonradiating, no other alleviating or aggravating factors. She has no other associated symptoms and she has not tried anything for her pain  She additionally concerned with worsening depression and anxiety she is not sure if it is related to increased family stressors with worrying that her daughter, her son's health problems and her husband's MS also working from home and more difficulty with job performance and ADHD or possibly she thinks may be worse because of menopausal mood changes. She has a long history of anxiety and depression and states that she has "tried every medicine there is" in the past but does remember doing well with Lexapro at some point in time. I did review office visits through 2020 and 2018 she had multiple visits for depressive symptoms, history of prior opiate misuse history and Suboxone management for chronic pain after surgery that does appear that she  had a history of becoming addicted to [facility] for chronic migraines and chronic neck pain -that was previously managed by specialist at cognitive psychiatry at [facility] Dr. Lenise Arena  Over the past couple years she is come to her PCP office to manage her depressive symptoms, recently has tried Effexor but is not currently taking this, about 1 year ago she discussed recurrent depressive symptoms with Suezanne Cheshire NP and she started Wellbutrin 150 mg once  daily, she has been taking this for almost past year would be out of medications in a few days, she does not feel that this is helped her depressive symptoms at all, she feels anxious, stressed, feels like she is a failure, is having difficulty concentrating, she can fall asleep easily but wakes up frequently and has trouble getting back to sleep. Working from home has made her focus and ADHD symptoms more bothersome, if she gets interrupted at all she has difficult time getting back on track or completing anything. She feels overwhelmed with her husband and children's health issues. Is not currently talking to a therapist but does feel that it would be helpful PHQ and GAD-7 screening reviewed today Depression screen [facility] 2/9 07/04/2019 07/07/2018 08/24/2016  Decreased Interest 2 0 3  Down, Depressed, Hopeless 1 0 3  PHQ - 2 Score 3 0 6  Altered sleeping 3 0 3  Tired, decreased energy 3 0 3  Change in appetite 1 0 3  Feeling bad or failure about yourself  3 0 3  Trouble concentrating 3 0 3  Moving slowly or fidgety/restless 0 0 0  Suicidal thoughts 0 0 0  PHQ-9 Score 16 0 21  Difficult doing work/chores Very difficult Not difficult at all Extremely dIfficult   GAD 7 : Generalized Anxiety Score 07/04/2019  Nervous, Anxious, on Edge 2  Control/stop worrying 2  Worry too much - different things 3  Trouble relaxing 2  Restless 0  Easily annoyed or irritable 1  Afraid - awful might happen 3  Total GAD 7 Score 13  Anxiety Difficulty Very difficult    Work and family stuff more difficult to get work done and concentrate -she was previously on Adderall she does feel that her ADHD symptoms are worsening, she used to use medications only on workdays, not currently getting or taking Adderall.  On her chart there is also Ativan, Suboxone, Effexor, all of which she is not currently taking.  History of migraines which has gradually been improving postmenopausal   Current Outpatient Medications:  .   buPROPion (WELLBUTRIN SR) 150 MG 12 hr tablet, Take 1 tablet (150 mg total) by mouth every morning., Disp: 90 tablet, Rfl: 3 .  Multiple Vitamin (MULTIVITAMIN) capsule, Take 1 capsule by mouth daily., Disp: , Rfl:  .  amphetamine-dextroamphetamine (ADDERALL) 7.5 MG tablet, Take 7.5 mg by mouth as needed. (Patient not taking: Reported on 07/04/2019), Disp: , Rfl:  .  calcium gluconate 1 g, magnesium sulfate 1 g in sodium chloride 0.9 % 100 mL, calcium (Patient not taking: Reported on 07/04/2019), Disp: , Rfl:  .  LORazepam (ATIVAN) 1 MG tablet, TK 1 T PO BID PRA (Patient not taking: Reported on 07/04/2019), Disp: , Rfl: 0 .  SUBOXONE 2-0.5 MG FILM, Take 1.5 mg by mouth daily. (Patient not taking: Reported on 07/04/2019), Disp: , Rfl:  .  venlafaxine XR (EFFEXOR-XR) 37.5 MG 24 hr capsule, Take 1 capsule (37.5 mg total) by mouth daily with breakfast. (Patient not taking: Reported on 07/07/2018), Disp: 30  capsule, Rfl: 1  Patient Active Problem List   Diagnosis Date Noted  . Chronic right hip pain 09/25/2014  . Choledocholithiasis 06/28/2014  . Obesity, Class I, BMI 30-34.9 06/20/2014  . Intractable chronic migraine without aura and without status migrainosus 04/18/2014  . History of fusion of cervical spine 09/15/2012    Past Surgical History:  Procedure Laterality Date  . BOTOX INJECTION N/A    Dr. Jannifer Franklin performs these throughout her scalp. Approx 25 injections.  . CERVICAL FUSION  05/06/2012  . CHOLECYSTECTOMY N/A 07/25/2014   Procedure: LAPAROSCOPIC CHOLECYSTECTOMY WITH INTRAOPERATIVE CHOLANGIOGRAM;  Surgeon: Robert Bellow, MD;  Location: [facility] ORS;  Service: General;  Laterality: N/A;    Family History  Problem Relation Age of Onset  . Heart attack Mother   . Cervical cancer Mother   . Aneurysm Mother   . Epilepsy Child        [place]  . Heart disease Paternal Grandmother     Social History   Tobacco Use  . Smoking status: Never Smoker  . Smokeless tobacco: Never Used  Substance  Use Topics  . Alcohol use: No    Alcohol/week: 0.0 standard drinks  . Drug use: No     No Known Allergies  Health Maintenance  Topic Date Due  . Hepatitis C Screening  Never done  . COVID-19 Vaccine (1) Never done  . HIV Screening  Never done  . MAMMOGRAM  Never done  . COLONOSCOPY  Never done  . PAP SMEAR-Modifier  03/21/2019  . INFLUENZA VACCINE  08/06/2019  . TETANUS/TDAP  01/05/2025    Chart Review Today: I personally reviewed active problem list, medication list, allergies, family history, social history, health maintenance, notes from last encounter, lab results, imaging with the patient/caregiver today.   Review of Systems  10 Systems reviewed and are negative for acute change except as noted in the HPI.  Objective:   Vitals:   07/04/19 1421  BP: 118/80  Pulse: 92  Resp: 16  Temp: (!) 97.4 F (36.3 C)  TempSrc: Temporal  SpO2: 99%  Weight: 179 lb 3.2 oz (81.3 kg)  Height: _0  (1.626 m)    Body mass index is 30.76 kg/m.  Physical Exam Vitals and nursing note reviewed.  Constitutional:      General: She is not in acute distress.    Appearance: Normal appearance. She is well-developed. She is not ill-appearing, toxic-appearing or diaphoretic.     Interventions: Face mask in place.  HENT:     Head: Normocephalic and atraumatic.     Right Ear: External ear normal.     Left Ear: External ear normal.  Eyes:     General: Lids are normal. No scleral icterus.       Right eye: No discharge.        Left eye: No discharge.     Conjunctiva/sclera: Conjunctivae normal.  Neck:     Trachea: Phonation normal. No tracheal deviation.  Cardiovascular:     Rate and Rhythm: Normal rate and regular rhythm.     Pulses: Normal pulses.          Radial pulses are 2+ on the right side and 2+ on the left side.       Posterior tibial pulses are 2+ on the right side and 2+ on the left side.     Heart sounds: Normal heart sounds. No murmur heard.  No friction rub. No  gallop.   Pulmonary:     Effort: Pulmonary effort  is normal. No respiratory distress.     Breath sounds: Normal breath sounds. No stridor. No wheezing, rhonchi or rales.  Chest:     Chest wall: No tenderness.  Abdominal:     General: Abdomen is flat. Bowel sounds are normal. There is no distension.     Palpations: Abdomen is soft. There is no hepatomegaly, splenomegaly or pulsatile mass.     Tenderness: There is abdominal tenderness in the right upper quadrant and epigastric area. There is no right CVA tenderness, left CVA tenderness, guarding or rebound. Negative signs include Murphy's sign and McBurney's sign.     Comments: Very mild tenderness to palpation to epigastrium and right upper quadrant with very deep palpation no guarding or rebound  Musculoskeletal:     Cervical back: Normal range of motion and neck supple. No rigidity.     Right lower leg: No edema.     Left lower leg: No edema.  Skin:    General: Skin is warm and dry.     Coloration: Skin is not jaundiced or pale.     Findings: No rash.  Neurological:     Mental Status: She is alert.     Motor: No abnormal muscle tone.     Gait: Gait normal.  Psychiatric:        Attention and Perception: Attention normal.        Mood and Affect: Mood is anxious and depressed. Affect is tearful.        Speech: Speech normal.        Behavior: Behavior normal. Behavior is cooperative.         Assessment & Plan:     ICD-10-CM   1. Family history of ankylosing spondylitis  Z82.69 Ambulatory referral to Rheumatology   Refer to rheumatology for further evaluation, patient has lumbar spine & SI joint area pain- chronic for >15 years and history of cervical spinal surgery onset prior to 54 y/o No other obvious symptoms or conditions associated with HLA-B27 and or ankylosing spondylitis -  She has no dactylitis, peripheral arthritis, hip pain no history of uveitis, inflammatory bowel disease, psoriasis   2. History of fusion of  cervical spine  Z98.1    see above - pt would like eval for ankylosing spondylitis and HLA-B27 due to her son's recent diagnosis and her past medical history   3. Chronic midline back pain, unspecified back location  M54.9    G89.29    Mild to moderate chronic and intermittent lumbar back pain radiates to SI joint area and occasionally has radicular symptoms   4. Pain of upper abdomen  R10.10 pantoprazole (PROTONIX) 20 MG tablet US Abdomen Limited RUQ    DG Abd 1 View    H.pylori screen, POC    COMPLETE METABOLIC PANEL WITH GFR    CBC with Differential/Platelet    Lipase   Mild epigastric and right upper quadrant tenderness to palpation without guarding or rebound, trial PPI, possible RUQ [place] or plain films to eval? More likely upper GI etiology - Ddx GERD, gastritis, peptic ulcer Screening labs today-rule out H. Pylori, discussed with the patient that we will check liver enzymes and some of her digestive enzymes, CBC to ensure no concerns for infection or anemia Since she has had her gallbladder out insurance may not approve a right upper quadrant ultrasound and I explained this to the patient if the ultrasound is not approved then plain films to screen and evaluate may be appropriate -look at gas and  bowel pattern, look at costophrenic angles etc.  Start PPI trial- explained 2-4 weeks trial and we will f/up in 6 weeks, if imaging is unremarkable and Protonix does not improve her symptoms we will refer to GI for further evaluation    5. Moderate episode of recurrent major depressive disorder ([place])  F33.1 Ambulatory referral to Psychiatry escitalopram (LEXAPRO) 10 MG tablet   Patient very anxious and depressed, screenings are positive today, tearful, continue Wellbutrin same dose-  add Lexapro 10 mg, 6-week follow-up Encouraged the patient to establish with a therapist    6. Anxiety disorder, unspecified type  F41.9 Ambulatory referral to Psychiatry escitalopram (LEXAPRO) 10 MG tablet    Encourage patient to find a therapist start working on coping skills, we can adjust medication but symptoms will be managed best with therapy and meds    7. Attention deficit hyperactivity disorder (ADHD), unspecified ADHD type  F90.9    Prior diagnoses of ADHD not currently on medications discussed the possibility of adding them on at future visits if needed  -upon further review of chart - pt on phentermine - will write pt a message to let her know my policy that I will not prescribe Adderall or other stimulant medications when patient is currently taking phentermine or other stimulants -the risk outweighs the benefits   8. Insomnia due to other mental disorder  F51.05    F99    Addressing above diagnoses will improve insomnia - see plan for #5&6   9. History of narcotic addiction ([place])  F11.21    I had time to review the chart more, more complicated behavioral and psychiatric history than what was discussed during office visit, reviewed PDMP Pt is currently on phentermine- reviewed notes from multiple providers over the past 3 years-we will exercise caution with any controlled substances due to her history Her anxiety and depression may be more complex than what we discussed today    10. S/P cholecystectomy  Z90.49    She is concerned abdominal pain is related to cholecystectomy, 4+ years ago, reviewed old imaging and OP note/procedure - labs today, unlikely that intermittent pain is related to old surgery -   I explained that she did have a diagnosis of choledocholithiasis and during procedure it did cholecystectomy and did intraoperative cholangiogram which checks that there are no remaining or obstructing stones. . CHOLECYSTECTOMY N/A 07/25/2014   Procedure: LAPAROSCOPIC CHOLECYSTECTOMY WITH INTRAOPERATIVE CHOLANGIOGRAM;  Surgeon: Robert Bellow, MD;  Location: [facility] ORS;  Service: General;  Laterality: N/A;       Return in about 6 weeks (around 08/15/2019) for med recheck .    Recheck lexapro and wellbutrin for anxiety and depression Recheck PPI - refer to GI if no improvement in sx  Greater than 50% of this visit was spent in direct face-to-face counseling, obtaining history and physical, discussing and educating pt on treatment plan.  Total time of this visit was 50+.  Remainder of time involved but was not limited to reviewing chart (recent and pertinent OV notes and labs), documentation in EMR, and coordinating care and treatment plan.   Delsa Grana, PA-C 07/04/19 2:33 PM

## 2019-07-05 LAB — CBC WITH DIFFERENTIAL/PLATELET
Absolute Monocytes: 390 cells/uL (ref 200–950)
Basophils Absolute: 51 cells/uL (ref 0–200)
Basophils Relative: 0.8 %
Eosinophils Absolute: 160 cells/uL (ref 15–500)
Eosinophils Relative: 2.5 %
HCT: 38.2 % (ref 35.0–45.0)
Hemoglobin: 12.6 g/dL (ref 11.7–15.5)
Lymphs Abs: 2899 cells/uL (ref 850–3900)
MCH: 30.8 pg (ref 27.0–33.0)
MCHC: 33 g/dL (ref 32.0–36.0)
MCV: 93.4 fL (ref 80.0–100.0)
MPV: 10.7 fL (ref 7.5–12.5)
Monocytes Relative: 6.1 %
Neutro Abs: 2899 cells/uL (ref 1500–7800)
Neutrophils Relative %: 45.3 %
Platelets: 274 10*3/uL (ref 140–400)
RBC: 4.09 10*6/uL (ref 3.80–5.10)
RDW: 13.2 % (ref 11.0–15.0)
Total Lymphocyte: 45.3 %
WBC: 6.4 10*3/uL (ref 3.8–10.8)

## 2019-07-05 LAB — COMPLETE METABOLIC PANEL WITH GFR
AG Ratio: 1.4 (calc) (ref 1.0–2.5)
ALT: 8 U/L (ref 6–29)
AST: 13 U/L (ref 10–35)
Albumin: 4.2 g/dL (ref 3.6–5.1)
Alkaline phosphatase (APISO): 103 U/L (ref 37–153)
BUN: 13 mg/dL (ref 7–25)
CO2: 32 mmol/L (ref 20–32)
Calcium: 9.4 mg/dL (ref 8.6–10.4)
Chloride: 101 mmol/L (ref 98–110)
Creat: 0.76 mg/dL (ref 0.50–1.05)
GFR, Est African American: 104 mL/min/{1.73_m2} (ref >=60)
GFR, Est Non African American: 90 mL/min/{1.73_m2} (ref >=60)
Globulin: 2.9 g/dL (calc) (ref 1.9–3.7)
Glucose, Bld: 90 mg/dL (ref 65–99)
Potassium: 4.3 mmol/L (ref 3.5–5.3)
Sodium: 139 mmol/L (ref 135–146)
Total Bilirubin: 0.2 mg/dL (ref 0.2–1.2)
Total Protein: 7.1 g/dL (ref 6.1–8.1)

## 2019-07-05 LAB — LIPASE: Lipase: 22 U/L (ref 7–60)

## 2019-07-11 ENCOUNTER — Ambulatory Visit: Payer: Managed Care, Other (non HMO)

## 2019-07-17 ENCOUNTER — Ambulatory Visit
Admission: RE | Admit: 2019-07-17 | Discharge: 2019-07-17 | Disposition: A | Discharge status: Home / Self Care | Payer: Managed Care, Other (non HMO) | Source: Ambulatory Visit | Attending: Family Medicine | Admitting: Family Medicine

## 2019-07-17 ENCOUNTER — Other Ambulatory Visit: Payer: Self-pay

## 2019-07-17 DIAGNOSIS — R1010 Upper abdominal pain, unspecified: Secondary | ICD-10-CM | POA: Diagnosis present

## 2019-08-15 ENCOUNTER — Telehealth (INDEPENDENT_AMBULATORY_CARE_PROVIDER_SITE_OTHER): Payer: Managed Care, Other (non HMO) | Admitting: Family Medicine

## 2019-08-15 ENCOUNTER — Encounter: Payer: Self-pay | Admitting: Family Medicine

## 2019-08-15 VITALS — Ht 65.0 in | Wt 179.0 lb

## 2019-08-15 DIAGNOSIS — F5105 Insomnia due to other mental disorder: Secondary | ICD-10-CM | POA: Diagnosis not present

## 2019-08-15 DIAGNOSIS — F331 Major depressive disorder, recurrent, moderate: Secondary | ICD-10-CM

## 2019-08-15 DIAGNOSIS — F99 Mental disorder, not otherwise specified: Secondary | ICD-10-CM

## 2019-08-15 DIAGNOSIS — F909 Attention-deficit hyperactivity disorder, unspecified type: Secondary | ICD-10-CM

## 2019-08-15 DIAGNOSIS — F419 Anxiety disorder, unspecified: Secondary | ICD-10-CM

## 2019-08-15 DIAGNOSIS — F3341 Major depressive disorder, recurrent, in partial remission: Secondary | ICD-10-CM | POA: Diagnosis not present

## 2019-08-15 DIAGNOSIS — F1121 Opioid dependence, in remission: Secondary | ICD-10-CM

## 2019-08-15 MED ORDER — BUPROPION HCL ER (XL) 150 MG PO TB24
150.0000 mg | ORAL_TABLET | Freq: Every day | ORAL | 2 refills | Status: DC
Start: 2019-08-15 — End: 2019-08-15

## 2019-08-15 MED ORDER — BUPROPION HCL ER (XL) 150 MG PO TB24: 150.0000 mg | ORAL_TABLET | Freq: Every day | ORAL | 2 refills | Status: DC

## 2019-08-15 NOTE — Progress Notes (Signed)
Name: Mindy Jenkins   MRN: [id number]    DOB: August 11, 1965   Date:08/15/2019       Progress Note  Subjective:    Chief Complaint  Chief Complaint  Patient presents with  . Follow-up    med recheck  . Depression    I connected with  Chudney C Arrambide  on 08/15/19 at  1:40 PM EDT by a video enabled telemedicine application and verified that I am speaking with the correct person using two identifiers.  I discussed the limitations of evaluation and management by telemedicine and the availability of in person appointments. The patient expressed understanding and agreed to proceed. Staff also discussed with the patient that there may be a patient responsible charge related to this service. Patient Location:  home Provider Location: [facility]  Additional Individuals present: none  HPI Pt presents for f/up on depression and meds She started lexapro, she feels like its helping, she still has a lot going on in her life.  PHQ is still positive, slightly improved from prior, reviewed today  Depression screen [facility] 2/9 08/15/2019 07/04/2019 07/07/2018  Decreased Interest 1 2 0  Down, Depressed, Hopeless 2 1 0  PHQ - 2 Score 3 3 0  Altered sleeping 0 3 0  Tired, decreased energy 3 3 0  Change in appetite 2 1 0  Feeling bad or failure about yourself  2 3 0  Trouble concentrating 3 3 0  Moving slowly or fidgety/restless 0 0 0  Suicidal thoughts 0 0 0  PHQ-9 Score 13 16 0  Difficult doing work/chores Somewhat difficult Very difficult Not difficult at all   GAD 7 : Generalized Anxiety Score 07/04/2019  Nervous, Anxious, on Edge 2  Control/stop worrying 2  Worry too much - different things 3  Trouble relaxing 2  Restless 0  Easily annoyed or irritable 1  Afraid - awful might happen 3  Total GAD 7 Score 13  Anxiety Difficulty Very difficult   Did not have GAD done today, she still feels anxioius  GI sx - none  For back pain/arthralgias and son's new dx, pt does not want a  referral now and states she is asx.  Reviewed pt's labs, RUQ [place], reviewed fatty liver disease - all today while on virtual appt  She has not been able to find psych specialists to see yet for counseling or for further assessment.    Patient Active Problem List   Diagnosis Date Noted  . Anxiety disorder 07/04/2019  . Moderate episode of recurrent major depressive disorder (HCC) 07/04/2019  . Chronic midline back pain 07/04/2019  . Family history of ankylosing spondylitis 07/04/2019  . History of cervical spinal surgery 07/04/2019  . Attention deficit hyperactivity disorder (ADHD) 07/04/2019  . Insomnia due to other mental disorder 07/04/2019  . Chronic right hip pain 09/25/2014  . Choledocholithiasis 06/28/2014  . Obesity, Class I, BMI 30-34.9 06/20/2014  . Intractable chronic migraine without aura and without status migrainosus 04/18/2014  . History of fusion of cervical spine 09/15/2012    Social History   Tobacco Use  . Smoking status: Never Smoker  . Smokeless tobacco: Never Used  Substance Use Topics  . Alcohol use: No    Alcohol/week: 0.0 standard drinks     Current Outpatient Medications:  .  buPROPion (WELLBUTRIN SR) 150 MG 12 hr tablet, Take 1 tablet (150 mg total) by mouth every morning., Disp: 90 tablet, Rfl: 3 .  cholecalciferol (VITAMIN D3) 25 MCG (1000  UNIT) tablet, Take 1,000 Units by mouth daily., Disp: , Rfl:  .  escitalopram (LEXAPRO) 10 MG tablet, Take 1 tablet (10 mg total) by mouth daily., Disp: 60 tablet, Rfl: 1 .  Multiple Vitamin (MULTIVITAMIN) capsule, Take 1 capsule by mouth daily., Disp: , Rfl:   No Known Allergies  I personally reviewed active problem list, medication list, allergies, family history, social history, health maintenance, notes from last encounter, lab results, imaging with the patient/caregiver today.   Review of Systems  10 Systems reviewed and are negative for acute change except as noted in the HPI.   Objective:   Virtual  encounter, vitals limited, only able to obtain the following Today's Vitals   08/15/19 1135  Weight: 179 lb (81.2 kg)  Height: 5\' 5"  (1.651 m)   Body mass index is 29.79 kg/m. Nursing Note and Vital Signs reviewed.  Physical Exam Vitals and nursing note reviewed.  Constitutional:      General: She is not in acute distress.    Appearance: Normal appearance. She is well-developed. She is not ill-appearing, toxic-appearing or diaphoretic.  HENT:     Head: Normocephalic and atraumatic.  Neck:     Trachea: No tracheal deviation.  Pulmonary:     Effort: No respiratory distress.  Neurological:     Mental Status: She is alert.  Psychiatric:        Mood and Affect: Mood and affect normal.        Behavior: Behavior is cooperative.        Thought Content: Thought content does not include homicidal or suicidal plan.     PE limited by telephone encounter  No results found for this or any previous visit (from the past 72 hour(s)).  Assessment and Plan:     ICD-10-CM   1. Anxiety disorder, unspecified type  F41.9 buPROPion (WELLBUTRIN XL) 150 MG 24 hr tablet   Some improvement in sx, still needs to see psych specialists for evan and would help with getting pt est with therapist - continue lexapro 10 mg daily She had some concerns about different kinds of wellbutrin than what she was on in the past - very difficult to follow pt today and review chart and meds to see what she was talking about - we'll switch her back to wellbutrin 150 XR initially - and if tolerated and helping - and not worsening anxiety, pt may increase to 300 mg daily      2. Recurrent major depressive disorder, in partial remission (HCC)  F33.41    some mild improvement in depressive sx, continue lexapro 10 mg and switch wellbutrin as noted above, would increase lexapro if needed in the next month Pt reports depressive sx are much better than last OV, that improvements not really reflected in PHQ scores.    After  very long time on video with pt (ovwer 25 min) I explained that I needed to end the call and we were out of time, she then asked if I was going to talk to her about ADHD neds? We did not have time to address this, nor would I feel comfortable starting any ADHD meds for her with multiple psych issues and sx  - esp with her reported anxiety - I am concerned stimulants could make it worse and she really needs a specialists to address the complexity of all these things together.   1. Anxiety disorder, unspecified type - buPROPion (WELLBUTRIN XL) 150 MG 24 hr tablet; Take 1 tablet (150 mg total)  by mouth daily.  Dispense: 60 tablet; Refill: 2 - Ambulatory referral to Psychiatry  2. Insomnia due to other mental disorder - Ambulatory referral to Psychiatry  3. Attention deficit hyperactivity disorder (ADHD), unspecified ADHD type - Ambulatory referral to Psychiatry  4. History of narcotic addiction (HCC) - Ambulatory referral to Psychiatry  5. Moderate episode of recurrent major depressive disorder (HCC) - Ambulatory referral to Psychiatry   I provided  30-+ minutes of non-face-to-face time during this encounter.  Danelle Berry, PA-C 08/15/19 1:53 PM

## 2019-09-05 ENCOUNTER — Encounter: Payer: Self-pay | Admitting: Family Medicine

## 2020-08-07 ENCOUNTER — Other Ambulatory Visit: Payer: Self-pay

## 2020-08-07 DIAGNOSIS — F419 Anxiety disorder, unspecified: Secondary | ICD-10-CM

## 2020-08-07 MED ORDER — BUPROPION HCL ER (XL) 150 MG PO TB24: 150.0000 mg | ORAL_TABLET | Freq: Every day | ORAL | 0 refills | Status: DC

## 2020-08-15 ENCOUNTER — Ambulatory Visit: Payer: Self-pay | Admitting: *Deleted

## 2020-08-15 NOTE — Telephone Encounter (Signed)
Per agent: "Pt's husband is covid positive, Pt has tested negative for covid. She has a fever, she is extremely fatigued, feels miserable, slight congestion, and also lots of pressure in her face."        Pt reports husband tested positive 'Sunday. States her symptoms started Monday or Tuesday night. Reports has tested neg x 3 with rapid tests. Last one at [facility] today. Reports fever 99.5  "I usually run 97.0" Extreme fatigue, body aches, cough, mild congestion. Denies any SOB.States "Just feel awful, wiped out." Advised to retest tomorrow, consider PCR testing. Home care advise given per covid suspected protocol. Advised need positive test before oral anti virals considered. Guidelines for self isolation reviewed as well as symptoms that warrant an ED eval. Assured pt NT would route to practice for PCPs review. Pt verbalizes understanding. CB# [phone] Reason for Disposition  [1] COVID-19 diagnosed by positive lab test (e.g., PCR, rapid self-test kit) AND [2] mild symptoms (e.g., cough, fever, others) AND [3] no complications or SOB  Answer Assessment - Initial Assessment Questions 1. COVID-19 DIAGNOSIS: "Who made your COVID-19 diagnosis?" "Was it confirmed by a positive lab test or self-test?" If not diagnosed by a doctor (or NP/PA), ask "Are there lots of cases (community spread) where you live?" Note: See public health department website, if unsure.     Has tested negative x 3 2. COVID-19 EXPOSURE: "Was there any known exposure to COVID before the symptoms began?" CDC Definition of close contact: within 6 feet (2 meters) for a total of 15 minutes or more over a 24-hour period.      Sunday husband 3. ONSET: "When did the COVID-19 symptoms start?"      Monday or Tuesday evening 4. WORST SYMPTOM: "What is your worst symptom?" (e.g., cough, fever, shortness of breath, muscle aches)     Fatigued "wiped out" 5. COUGH: "Do you have a cough?" If Yes, ask: "How bad is the cough?"       Yes, mild  congestion 6. FEVER: "Do you have a fever?" If Yes, ask: "What is your temperature, how was it measured, and when did it start?"     99.5  Runs 97 usually 7. RESPIRATORY STATUS: "Describe your breathing?" (e.g., shortness of breath, wheezing, unable to speak)      No 8. BETTER-SAME-WORSE: "Are you getting better, staying the same or getting worse compared to yesterday?"  If getting worse, ask, "In what way?"     Worse 9. HIGH RISK DISEASE: "Do you have any chronic medical problems?" (e.g., asthma, heart or lung disease, weak immune system, obesity, etc.)      10' . VACCINE: "Have you had the COVID-19 vaccine?" If Yes, ask: "Which one, how many shots, when did you get it?"       2 Pfizer 11. BOOSTER: "Have you received your COVID-19 booster?" If Yes, ask: "Which one and when did you get it?"       1 moderna  LAst 01/04/20  13. OTHER SYMPTOMS: "Do you have any other symptoms?"  (e.g., chills, fatigue, headache, loss of smell or taste, muscle pain, sore throat)       Fatigued, foggy feeling, body aches,  14. O2 SATURATION MONITOR:  "Do you use an oxygen saturation monitor (pulse oximeter) at home?" If Yes, ask "What is your reading (oxygen level) today?" "What is your usual oxygen saturation reading?" (e.g., 95%)       NA  Protocols used: Coronavirus (COVID-19) Diagnosed or Suspected-A-AH

## 2020-08-16 NOTE — Telephone Encounter (Signed)
LVM for patient to login to mychart to do an e-visit for an evaluation of her symptoms per provider Danelle Berry, PA-C.

## 2020-09-03 ENCOUNTER — Other Ambulatory Visit: Payer: Self-pay | Admitting: Family Medicine

## 2020-09-03 DIAGNOSIS — F419 Anxiety disorder, unspecified: Secondary | ICD-10-CM

## 2020-09-12 ENCOUNTER — Other Ambulatory Visit: Payer: Self-pay | Admitting: Family Medicine

## 2020-09-12 DIAGNOSIS — F419 Anxiety disorder, unspecified: Secondary | ICD-10-CM

## 2020-09-12 NOTE — Telephone Encounter (Signed)
Medication Refill - Medication: buPROPion (WELLBUTRIN XL) 150 MG 24 hr tablet   *Pt called in and set appt for 09/13 and she is completely out of her medication and wanted to see about getting a partial refill until appt, please advise.*   Has the patient contacted their pharmacy? No.  Preferred Pharmacy (with phone number or street name):  [id number] - [place], [place] - [address] AT [facility] [address]  Phone:  [phone] Fax:  [phone]  Agent: Please be advised that RX refills may take up to 3 business days. We ask that you follow-up with your pharmacy.

## 2020-09-13 MED ORDER — BUPROPION HCL ER (XL) 150 MG PO TB24
150.0000 mg | ORAL_TABLET | Freq: Every day | ORAL | 0 refills | Status: DC
Start: 2020-09-13 — End: 2020-09-26

## 2020-09-17 ENCOUNTER — Telehealth: Payer: Managed Care, Other (non HMO) | Admitting: Family Medicine

## 2020-09-26 ENCOUNTER — Encounter: Payer: Self-pay | Admitting: Family Medicine

## 2020-09-26 ENCOUNTER — Telehealth (INDEPENDENT_AMBULATORY_CARE_PROVIDER_SITE_OTHER): Payer: Managed Care, Other (non HMO) | Admitting: Family Medicine

## 2020-09-26 VITALS — Ht 65.0 in | Wt 175.0 lb

## 2020-09-26 DIAGNOSIS — F419 Anxiety disorder, unspecified: Secondary | ICD-10-CM | POA: Diagnosis not present

## 2020-09-26 DIAGNOSIS — N760 Acute vaginitis: Secondary | ICD-10-CM | POA: Diagnosis not present

## 2020-09-26 MED ORDER — METRONIDAZOLE 0.75 % VA GEL: VAGINAL | 0 refills | Status: DC

## 2020-09-26 MED ORDER — BUPROPION HCL ER (XL) 150 MG PO TB24: 150.0000 mg | ORAL_TABLET | Freq: Every day | ORAL | 3 refills | Status: DC

## 2020-09-26 NOTE — Progress Notes (Signed)
Name: Mindy Jenkins   MRN: [id number]    DOB: Mar 15, 1965   Date:09/26/2020       Progress Note  Subjective:    I connected with  Mindy Jenkins  on 09/26/20 at  1:20 PM EDT by a video enabled telemedicine application and verified that I am speaking with the correct person using two identifiers.  I discussed the limitations of evaluation and management by telemedicine and the availability of in person appointments. The patient expressed understanding and agreed to proceed. Staff also discussed with the patient that there may be a patient responsible charge related to this service. Patient Location: home Provider Location: [facility] clinic Additional Individuals present: none  Chief Complaint  Patient presents with   Anxiety    Mindy Jenkins is a 55 y.o. female, presents for virtual visit for routine follow up on the conditions listed above.  Refill on meds Previously on lexapro and wanted to switch back to wellbutrin.  She did switch back to wellbutrin last year 150 mg XR and she feels that her moods and anxiety are well controlled with this and no SE or concerns  Depression screen [facility] 2/9 09/26/2020 08/15/2019 07/04/2019  Decreased Interest 0 1 2  Down, Depressed, Hopeless 0 2 1  PHQ - 2 Score 0 3 3  Altered sleeping 0 0 3  Tired, decreased energy 0 3 3  Change in appetite 0 2 1  Feeling bad or failure about yourself  0 2 3  Trouble concentrating 0 3 3  Moving slowly or fidgety/restless 0 0 0  Suicidal thoughts 0 0 0  PHQ-9 Score 0 13 16  Difficult doing work/chores Not difficult at all Somewhat difficult Very difficult   GAD 7 : Generalized Anxiety Score 09/26/2020 07/04/2019  Nervous, Anxious, on Edge 0 2  Control/stop worrying 0 2  Worry too much - different things 1 3  Trouble relaxing 1 2  Restless 0 0  Easily annoyed or irritable 0 1  Afraid - awful might happen 0 3  Total GAD 7 Score 2 13  Anxiety Difficulty Somewhat difficult Very difficult    Phq neg,  gad reviewed and much improved since last OV  Hx of recurrent BV, which has occurred more frequently and is less effectively tx with boric acid.  Seems to have gotten worse since going through menopause Not sexually active  Has vaginal odor and mild discharge  Patient Active Problem List   Diagnosis Date Noted   Anxiety disorder 07/04/2019   Moderate episode of recurrent major depressive disorder (HCC) 07/04/2019   Chronic midline back pain 07/04/2019   Family history of ankylosing spondylitis 07/04/2019   History of cervical spinal surgery 07/04/2019   Attention deficit hyperactivity disorder (ADHD) 07/04/2019   Insomnia due to other mental disorder 07/04/2019   Chronic right hip pain 09/25/2014   Choledocholithiasis 06/28/2014   Obesity, Class I, BMI 30-34.9 06/20/2014   Intractable chronic migraine without aura and without status migrainosus 04/18/2014   History of fusion of cervical spine 09/15/2012    Current Outpatient Medications:    buPROPion (WELLBUTRIN XL) 150 MG 24 hr tablet, Take 1 tablet (150 mg total) by mouth daily., Disp: 90 tablet, Rfl: 0   cholecalciferol (VITAMIN D3) 25 MCG (1000 UNIT) tablet, Take 1,000 Units by mouth daily., Disp: , Rfl:    Multiple Vitamin (MULTIVITAMIN) capsule, Take 1 capsule by mouth daily., Disp: , Rfl:  No Known Allergies  Past Surgical History:  Procedure Laterality Date  BOTOX INJECTION N/A    Dr. Anne Hahn performs these throughout her scalp. Approx 25 injections.   CERVICAL FUSION  05/06/2012   CHOLECYSTECTOMY N/A 07/25/2014   Procedure: LAPAROSCOPIC CHOLECYSTECTOMY WITH INTRAOPERATIVE CHOLANGIOGRAM;  Surgeon: Earline Mayotte, MD;  Location: [facility] ORS;  Service: General;  Laterality: N/A;   Family History  Problem Relation Age of Onset   Heart attack Mother    Cervical cancer Mother    Aneurysm Mother    Epilepsy Child        Rolandic   Heart disease Paternal Grandmother    Social History   Socioeconomic History   Marital  status: Married    Spouse name: Not on file   Number of children: 2   Years of education: Not on file   Highest education level: Not on file  Occupational History   Occupation: self employed    Comment: Licensed conveyancer  Tobacco Use   Smoking status: Never   Smokeless tobacco: Never  Substance and Sexual Activity   Alcohol use: No    Alcohol/week: 0.0 standard drinks   Drug use: No   Sexual activity: Yes    Partners: Male  Other Topics Concern   Not on file  Social History Narrative   Not on file   Social Determinants of Health   Financial Resource Strain: Not on file  Food Insecurity: Not on file  Transportation Needs: Not on file  Physical Activity: Not on file  Stress: Not on file  Social Connections: Not on file  Intimate Partner Violence: Not on file    Chart Review Today: I personally reviewed active problem list, medication list, allergies, family history, social history, health maintenance, notes from last encounter, lab results, imaging with the patient/caregiver today.   Review of Systems  Constitutional: Negative.   HENT: Negative.    Eyes: Negative.   Respiratory: Negative.    Cardiovascular: Negative.   Gastrointestinal: Negative.   Endocrine: Negative.   Genitourinary: Negative.   Musculoskeletal: Negative.   Skin: Negative.   Allergic/Immunologic: Negative.   Neurological: Negative.   Hematological: Negative.   Psychiatric/Behavioral: Negative.    All other systems reviewed and are negative.    Objective:    Virtual encounter, vitals limited, only able to obtain the following Today's Vitals   09/26/20 1133  Weight: 175 lb (79.4 kg)  Height: 5\' 5"  (1.651 m)   Body mass index is 29.12 kg/m. Nursing Note and Vital Signs reviewed.  Physical Exam Vitals and nursing note reviewed.  Constitutional:      General: She is not in acute distress.    Appearance: She is not ill-appearing, toxic-appearing or diaphoretic.  Pulmonary:     Effort:  No respiratory distress.  Neurological:     Mental Status: She is alert.  Psychiatric:        Mood and Affect: Mood normal.        Behavior: Behavior normal.    PE limited by telephone encounter  No results found for this or any previous visit (from the past 72 hour(s)).  PHQ2/9: Depression screen [facility] 2/9 09/26/2020 08/15/2019 07/04/2019 07/07/2018 08/24/2016  Decreased Interest 0 1 2 0 3  Down, Depressed, Hopeless 0 2 1 0 3  PHQ - 2 Score 0 3 3 0 6  Altered sleeping 0 0 3 0 3  Tired, decreased energy 0 3 3 0 3  Change in appetite 0 2 1 0 3  Feeling bad or failure about yourself  0 2 3 0 3  Trouble concentrating 0 3 3 0 3  Moving slowly or fidgety/restless 0 0 0 0 0  Suicidal thoughts 0 0 0 0 0  PHQ-9 Score 0 13 16 0 21  Difficult doing work/chores Not difficult at all Somewhat difficult Very difficult Not difficult at all Extremely dIfficult   PHQ-2/9 Result is neg, reviewed today  Fall Risk: Fall Risk  09/26/2020 08/15/2019 07/04/2019 07/07/2018 03/20/2016  Falls in the past year? 1 0 0 0 Yes  Number falls in past yr: 0 0 0 0 1  Injury with Fall? 0 0 0 0 Yes  Comment - - - - wrist injury left  Follow up - - Falls evaluation completed - -     Assessment and Plan:     ICD-10-CM   1. Anxiety disorder, unspecified type  F41.9 buPROPion (WELLBUTRIN XL) 150 MG 24 hr tablet   sx well controlled on only wellbutrin 150 mg, refills put in for one year, encouraged CPE soon and f/up if mood/anxiety sx worsen    2. Acute vaginitis  N76.0 metroNIDAZOLE (METROGEL VAGINAL) 0.75 % vaginal gel   recurrent BV, recently more frequent, not sexually active, tx with metrogel longer tx with biweekly, may need eval for atrophic vaginitis if not resolving      Reviewed with pt today her last labs and HM Health Maintenance  Topic Date Due   COVID-19 Vaccine (1) Never done   HIV Screening  Never done   MAMMOGRAM  Never done   Hepatitis C Screening  Never done   COLONOSCOPY (Pts 45-63yrs Insurance  coverage will need to be confirmed)  Never done   Zoster Vaccines- Shingrix (1 of 2) Never done   PAP SMEAR-Modifier  03/21/2019   INFLUENZA VACCINE  Never done   TETANUS/TDAP  01/05/2025   HPV VACCINES  Aged Out   Encouraged her to get CPE in the next 6 months  Overdue for most screening/preventative med  I discussed the assessment and treatment plan with the patient. The patient was provided an opportunity to ask questions and all were answered. The patient agreed with the plan and demonstrated an understanding of the instructions.  The patient was advised to call back or seek an in-person evaluation if the symptoms worsen or if the condition fails to improve as anticipated.  I provided 20 minutes of non-face-to-face time during this encounter.  Danelle Berry, PA-C 09/26/20 1:45 PM

## 2021-04-07 ENCOUNTER — Encounter: Payer: Self-pay | Admitting: Family Medicine

## 2021-04-07 ENCOUNTER — Ambulatory Visit: Payer: Managed Care, Other (non HMO) | Admitting: Family Medicine

## 2021-04-07 VITALS — BP 122/66 | HR 93 | Temp 98.3°F | Resp 16 | Ht 64.0 in | Wt 183.5 lb

## 2021-04-07 DIAGNOSIS — N952 Postmenopausal atrophic vaginitis: Secondary | ICD-10-CM | POA: Diagnosis not present

## 2021-04-07 DIAGNOSIS — F331 Major depressive disorder, recurrent, moderate: Secondary | ICD-10-CM | POA: Diagnosis not present

## 2021-04-07 DIAGNOSIS — Z0000 Encounter for general adult medical examination without abnormal findings: Secondary | ICD-10-CM

## 2021-04-07 DIAGNOSIS — F909 Attention-deficit hyperactivity disorder, unspecified type: Secondary | ICD-10-CM | POA: Diagnosis not present

## 2021-04-07 DIAGNOSIS — F419 Anxiety disorder, unspecified: Secondary | ICD-10-CM

## 2021-04-07 DIAGNOSIS — Z1159 Encounter for screening for other viral diseases: Secondary | ICD-10-CM

## 2021-04-07 DIAGNOSIS — Z780 Asymptomatic menopausal state: Secondary | ICD-10-CM

## 2021-04-07 DIAGNOSIS — Z23 Encounter for immunization: Secondary | ICD-10-CM

## 2021-04-07 DIAGNOSIS — Z114 Encounter for screening for human immunodeficiency virus [HIV]: Secondary | ICD-10-CM | POA: Diagnosis not present

## 2021-04-07 DIAGNOSIS — Z1211 Encounter for screening for malignant neoplasm of colon: Secondary | ICD-10-CM | POA: Diagnosis not present

## 2021-04-07 DIAGNOSIS — Z1231 Encounter for screening mammogram for malignant neoplasm of breast: Secondary | ICD-10-CM

## 2021-04-07 NOTE — Progress Notes (Deleted)
? ?Name: Mindy Jenkins   MRN: [id number]    DOB: 1965/05/11   Date:04/07/2021 ? ?     Progress Note ? ?Chief Complaint  ?Patient presents with  ? Follow-up  ? Depression  ? Anxiety  ? ? ? ?Subjective:  ? ?Mindy Jenkins is a 56 y.o. female, presents to clinic for  ? ? ? ? ? ? ?Current Outpatient Medications:  ?  buPROPion (WELLBUTRIN XL) 300 MG 24 hr tablet, Take 300 mg by mouth every morning., Disp: , Rfl:  ?  cholecalciferol (VITAMIN D3) 25 MCG (1000 UNIT) tablet, Take 1,000 Units by mouth daily., Disp: , Rfl:  ?  dexmethylphenidate (FOCALIN) 5 MG tablet, Take 5 mg by mouth 2 (two) times daily., Disp: , Rfl:  ?  Multiple Vitamin (MULTIVITAMIN) capsule, Take 1 capsule by mouth daily., Disp: , Rfl:  ?  buPROPion (WELLBUTRIN XL) 150 MG 24 hr tablet, Take 1 tablet (150 mg total) by mouth daily. (Patient not taking: Reported on 04/07/2021), Disp: 90 tablet, Rfl: 3 ?  metroNIDAZOLE (METROGEL VAGINAL) 0.75 % vaginal gel, Place 1 applicator full vaginally once daily at bedtime for 7-14 days and then twice weekly for 4 weeks for recurrent BV (Patient not taking: Reported on 04/07/2021), Disp: 70 g, Rfl: 0 ? ?Patient Active Problem List  ? Diagnosis Date Noted  ? Anxiety disorder 07/04/2019  ? Moderate episode of recurrent major depressive disorder ([place]) 07/04/2019  ? Chronic midline back pain 07/04/2019  ? Family history of ankylosing spondylitis 07/04/2019  ? History of cervical spinal surgery 07/04/2019  ? Attention deficit hyperactivity disorder (ADHD) 07/04/2019  ? Insomnia due to other mental disorder 07/04/2019  ? Chronic right hip pain 09/25/2014  ? Choledocholithiasis 06/28/2014  ? Obesity, Class I, BMI 30-34.9 06/20/2014  ? Intractable chronic migraine without aura and without status migrainosus 04/18/2014  ? History of fusion of cervical spine 09/15/2012  ? ? ?Past Surgical History:  ?Procedure Laterality Date  ? BOTOX INJECTION N/A   ? Dr. Jannifer Franklin performs these throughout her scalp. Approx 25 injections.  ?  CERVICAL FUSION  05/06/2012  ? CHOLECYSTECTOMY N/A 07/25/2014  ? Procedure: LAPAROSCOPIC CHOLECYSTECTOMY WITH INTRAOPERATIVE CHOLANGIOGRAM;  Surgeon: Robert Bellow, MD;  Location: [facility] ORS;  Service: General;  Laterality: N/A;  ? ? ?Family History  ?Problem Relation Age of Onset  ? Heart attack Mother   ? Cervical cancer Mother   ? Aneurysm Mother   ? Epilepsy Child   ?     Rolandic  ? Heart disease Paternal Grandmother   ? ? ?Social History  ? ?Tobacco Use  ? Smoking status: Never  ? Smokeless tobacco: Never  ?Substance Use Topics  ? Alcohol use: No  ?  Alcohol/week: 0.0 standard drinks  ? Drug use: No  ?  ? ?No Known Allergies ? ?Health Maintenance  ?Topic Date Due  ? COVID-19 Vaccine (1) Never done  ? MAMMOGRAM  Never done  ? Hepatitis C Screening  Never done  ? Fecal DNA (Cologuard)  Never done  ? Zoster Vaccines- Shingrix (1 of 2) Never done  ? PAP SMEAR-Modifier  03/21/2019  ? INFLUENZA VACCINE  08/05/2021  ? TETANUS/TDAP  01/05/2025  ? HIV Screening  Completed  ? HPV VACCINES  Aged Out  ? ? ?Chart Review Today: ?*** ? ?Review of Systems  ? ?Objective:  ? ?Vitals:  ? 04/07/21 1425  ?BP: 122/66  ?Pulse: 93  ?Resp: 16  ?Temp: 98.3 ?F (36.8 ?C)  ?TempSrc: Oral  ?  SpO2: 100%  ?Weight: 183 lb 8 oz (83.2 kg)  ?Height: 5\' 4"  (1.626 m)  ? ? ?Body mass index is 31.5 kg/m?. ? ?Physical Exam  ? ? ? ? ?Assessment & Plan:  ? ?*** ? ?No follow-ups on file.  ? ?Delsa Grana, PA-C ?04/07/21 2:30 PM ? ? ?

## 2021-04-07 NOTE — Patient Instructions (Signed)
? ? ?Preventive Care 33-55 Years Old, Female ?Preventive care refers to lifestyle choices and visits with your health care provider that can promote health and wellness. Preventive care visits are also called wellness exams. ?What can I expect for my preventive care visit? ?Counseling ?Your health care provider may ask you questions about your: ?Medical history, including: ?Past medical problems. ?Family medical history. ?Pregnancy history. ?Current health, including: ?Menstrual cycle. ?Method of birth control. ?Emotional well-being. ?Home life and relationship well-being. ?Sexual activity and sexual health. ?Lifestyle, including: ?Alcohol, nicotine or tobacco, and drug use. ?Access to firearms. ?Diet, exercise, and sleep habits. ?Work and work Statistician. ?Sunscreen use. ?Safety issues such as seatbelt and bike helmet use. ?Physical exam ?Your health care provider will check your: ?Height and weight. These may be used to calculate your BMI (body mass index). BMI is a measurement that tells if you are at a healthy weight. ?Waist circumference. This measures the distance around your waistline. This measurement also tells if you are at a healthy weight and may help predict your risk of certain diseases, such as type 2 diabetes and high blood pressure. ?Heart rate and blood pressure. ?Body temperature. ?Skin for abnormal spots. ?What immunizations do I need? ?Vaccines are usually given at various ages, according to a schedule. Your health care provider will recommend vaccines for you based on your age, medical history, and lifestyle or other factors, such as travel or where you work. ?What tests do I need? ?Screening ?Your health care provider may recommend screening tests for certain conditions. This may include: ?Lipid and cholesterol levels. ?Diabetes screening. This is done by checking your blood sugar (glucose) after you have not eaten for a while (fasting). ?Pelvic exam and Pap test. ?Hepatitis B  test. ?Hepatitis C test. ?HIV (human immunodeficiency virus) test. ?STI (sexually transmitted infection) testing, if you are at risk. ?Lung cancer screening. ?Colorectal cancer screening. ?Mammogram. Talk with your health care provider about when you should start having regular mammograms. This may depend on whether you have a family history of breast cancer. ?BRCA-related cancer screening. This may be done if you have a family history of breast, ovarian, tubal, or peritoneal cancers. ?Bone density scan. This is done to screen for osteoporosis. ?Talk with your health care provider about your test results, treatment options, and if necessary, the need for more tests. ?Follow these instructions at home: ?Eating and drinking ? ?Eat a diet that includes fresh fruits and vegetables, whole grains, lean protein, and low-fat dairy products. ?Take vitamin and mineral supplements as recommended by your health care provider. ?Do not drink alcohol if: ?Your health care provider tells you not to drink. ?You are pregnant, may be pregnant, or are planning to become pregnant. ?If you drink alcohol: ?Limit how much you have to 0-1 drink a day. ?Know how much alcohol is in your drink. In the [place] one drink equals one 12 oz bottle of beer (355 mL), one 5 oz glass of wine (148 mL), or one 1? oz glass of hard liquor (44 mL). ?Lifestyle ?Brush your teeth every morning and night with fluoride toothpaste. Floss one time each day. ?Exercise for at least 30 minutes 5 or more days each week. ?Do not use any products that contain nicotine or tobacco. These products include cigarettes, chewing tobacco, and vaping devices, such as e-cigarettes. If you need help quitting, ask your health care provider. ?Do not use drugs. ?If you are sexually active, practice safe sex. Use a condom or other form of protection  to prevent STIs. ?If you do not wish to become pregnant, use a form of birth control. If you plan to become pregnant, see your health care  provider for a prepregnancy visit. ?Take aspirin only as told by your health care provider. Make sure that you understand how much to take and what form to take. Work with your health care provider to find out whether it is safe and beneficial for you to take aspirin daily. ?Find healthy ways to manage stress, such as: ?Meditation, yoga, or listening to music. ?Journaling. ?Talking to a trusted person. ?Spending time with friends and family. ?Minimize exposure to UV radiation to reduce your risk of skin cancer. ?Safety ?Always wear your seat belt while driving or riding in a vehicle. ?Do not drive: ?If you have been drinking alcohol. Do not ride with someone who has been drinking. ?When you are tired or distracted. ?While texting. ?If you have been using any mind-altering substances or drugs. ?Wear a helmet and other protective equipment during sports activities. ?If you have firearms in your house, make sure you follow all gun safety procedures. ?Seek help if you have been physically or sexually abused. ?What's next? ?Visit your health care provider once a year for an annual wellness visit. ?Ask your health care provider how often you should have your eyes and teeth checked. ?Stay up to date on all vaccines. ?This information is not intended to replace advice given to you by your health care provider. Make sure you discuss any questions you have with your health care provider. ?Document Revised: 06/19/2020 Document Reviewed: 06/19/2020 ?Elsevier Patient Education ? [place]. ? ? ?Preventing Osteoporosis, Adult ?Osteoporosis is a condition that causes the bones to lose density. This means that the bones become thinner, and the normal spaces in bone tissue become larger. Low bone density can make the bones weak and cause them to break more easily. ?Osteoporosis cannot always be prevented, but you can take steps to lower your risk of developing this condition. ?How can this condition affect me? ?If you  develop osteoporosis, you will be more likely to break bones in your wrist, spine, or hip. Even a minor accident or injury can be enough to break weak bones. The bones will also be slower to heal. Osteoporosis can cause other problems as well, such as a stooped posture or trouble with movement. ?Osteoporosis can occur with aging. As you get older, you may lose bone tissue more quickly, or it may be replaced more slowly. Osteoporosis is more likely to develop if you have poor nutrition or do not get enough calcium or vitamin D. Other lifestyle factors can also play a role. By eating a well-balanced diet and making lifestyle changes, you can help keep your bones strong and healthy, lowering your chances of developing osteoporosis. ?What can increase my risk? ?The following factors may make you more likely to develop osteoporosis: ?Having a family history of the condition. ?Having poor nutrition or not getting enough calcium or vitamin D. ?Using certain medicines, such as steroid medicines or anti-seizure medicines. ?Being any of the following: ?54 years of age or older. ?Female. ?A woman who has gone through menopause (is postmenopausal). ?A person who is of European or Asian descent. ?Using products that contain nicotine or tobacco, such as cigarettes, e-cigarettes, and chewing tobacco. ?Not being physically active (being sedentary). ?Having a small body frame. ?What actions can I take to prevent this? ?Get enough calcium ? ?Make sure you get enough calcium every day.  Calcium is the most important mineral for bone health. Most people can get enough calcium from their diet, but supplements may be recommended for people who are at risk for osteoporosis. Follow these guidelines: ?If you are age 28 or younger, aim to get 1,000 milligrams (mg) of calcium every day. ?If you are older than age 72, aim to get 1,200 mg of calcium every day. ?Good sources of calcium include: ?Dairy products, such as low-fat or nonfat milk,  cheese, and yogurt. ?Dark green leafy vegetables, such as bok choy and broccoli. ?Foods that have had calcium added to them (calcium-fortified foods), such as orange juice, cereal, bread, soy beverages, and tofu products. ?

## 2021-04-07 NOTE — Progress Notes (Signed)
? ? ?Patient: Mindy Jenkins, Female    DOB: 1965/12/18, 56 y.o.   MRN: [id number] ?Delsa Grana, PA-C ?Visit Date: 04/07/2021 ? ?Today's Provider: Delsa Grana, PA-C  ? ?Chief Complaint  ?Patient presents with  ? Follow-up  ? Depression  ? Anxiety  ? ?Subjective:  ? ?Annual physical exam: ? ?Mindy Jenkins is a 56 y.o. female who presents today for complete physical exam: ? ?Exercise/Activity:  walking - around neighborhood, setting goals ?Diet/nutrition:  overall healthier, cutting out junkfood and limit snacks and late night stuff ?Sleep: falls asleep good but wakes up  ? ? ? ?Pt wished to do routine f/up on chronic conditions today in addition to CPE. ?Advised pt of separate visit billing/coding ? ?f/up on anxiety and depression ? ?On wellbutrin  ? ?Controlled substance database reviewed on phentermine last year and when off that got dexmethylphenidate - providers in [place] and [place] ? ? ?  04/07/2021  ?  2:19 PM 09/26/2020  ? 11:38 AM 08/15/2019  ? 11:38 AM  ?Depression screen PHQ 2/9  ?Decreased Interest 0 0 1  ?Down, Depressed, Hopeless 0 0 2  ?PHQ - 2 Score 0 0 3  ?Altered sleeping 0 0 0  ?Tired, decreased energy 0 0 3  ?Change in appetite 0 0 2  ?Feeling bad or failure about yourself  0 0 2  ?Trouble concentrating 0 0 3  ?Moving slowly or fidgety/restless 0 0 0  ?Suicidal thoughts 0 0 0  ?PHQ-9 Score 0 0 13  ?Difficult doing work/chores Not difficult at all Not difficult at all Somewhat difficult  ? ? ?  04/07/2021  ?  2:20 PM 09/26/2020  ? 11:38 AM 07/04/2019  ?  2:26 PM  ?GAD 7 : Generalized Anxiety Score  ?Nervous, Anxious, on Edge 0 0 2  ?Control/stop worrying 0 0 2  ?Worry too much - different things 0 1 3  ?Trouble relaxing 0 1 2  ?Restless 0 0 0  ?Easily annoyed or irritable 0 0 1  ?Afraid - awful might happen 0 0 3  ?Total GAD 7 Score 0 2 13  ?Anxiety Difficulty Not difficult at all Somewhat difficult Very difficult  ? ?Hx of health anxieties  ? ?Slow heart rate 50's, fatigue and hair  loss - no past thyroid issues  ? ?USPSTF grade A and B recommendations - reviewed and addressed today ? ?Depression:  ?Phq 9 completed today by patient, was reviewed by me with patient in the room ?PHQ score is negative, pt feels good ? ?  04/07/2021  ?  2:19 PM 09/26/2020  ? 11:38 AM 08/15/2019  ? 11:38 AM 07/04/2019  ?  2:25 PM  ?PHQ 2/9 Scores  ?PHQ - 2 Score 0 0 3 3  ?PHQ- 9 Score 0 0 13 16  ? ? ?  04/07/2021  ?  2:19 PM 09/26/2020  ? 11:38 AM 08/15/2019  ? 11:38 AM 07/04/2019  ?  2:25 PM 07/07/2018  ?  8:45 AM  ?Depression screen PHQ 2/9  ?Decreased Interest 0 0 1 2 0  ?Down, Depressed, Hopeless 0 0 2 1 0  ?PHQ - 2 Score 0 0 3 3 0  ?Altered sleeping 0 0 0 3 0  ?Tired, decreased energy 0 0 3 3 0  ?Change in appetite 0 0 2 1 0  ?Feeling bad or failure about yourself  0 0 2 3 0  ?Trouble concentrating 0 0 3 3 0  ?Moving slowly or fidgety/restless 0 0 0  0 0  ?Suicidal thoughts 0 0 0 0 0  ?PHQ-9 Score 0 0 13 16 0  ?Difficult doing work/chores Not difficult at all Not difficult at all Somewhat difficult Very difficult Not difficult at all  ? ? ?Alcohol screening: ?Flowsheet Row Video Visit from 08/15/2019 in [facility]  ?AUDIT-C Score 0  ? ?  ? ? ?Immunizations and Health Maintenance: ?Health Maintenance  ?Topic Date Due  ? COVID-19 Vaccine (1) Never done  ? MAMMOGRAM  Never done  ? Fecal DNA (Cologuard)  Never done  ? Zoster Vaccines- Shingrix (1 of 2) Never done  ? PAP SMEAR-Modifier  03/21/2019  ? INFLUENZA VACCINE  08/05/2021  ? TETANUS/TDAP  01/05/2025  ? Hepatitis C Screening  Completed  ? HIV Screening  Completed  ? HPV VACCINES  Aged Out  ?  ? ?Hep C Screening: will do today ? ?STD testing and prevention (HIV/chl/gon/syphilis):  see above, no additional testing desired by pt today - none ? ?Intimate partner violence: Denies ? ?Sexual History/Pain during Intercourse: Married - some dryness and discomfort ? ?Menstrual History/LMP/Abnormal Bleeding:   early menopause 80 ?No LMP recorded. Patient is  postmenopausal. ? ?Incontinence Symptoms: none ? ?Breast cancer:  ?Last Mammogram: *see HM list above ?BRCA gene screening: None known and family history ? ?Cervical cancer screening: Due, she will return to complete Pap ?Pt has family hx of cancers - mother ovarian  -no known breast, , uterine, colon:    ? ?Osteoporosis:   ?Discussion on osteoporosis per age, including high calcium and vitamin D supplementation, weight bearing exercises ?Pt is not supplementing with daily calcium/Vit D. ?None done Bone scan/dexa ?Roughly experienced menopause at age early 25's  ? ?Skin cancer:  Hx of skin CA -  NO ?Discussed atypical lesions  ? ?Colorectal cancer:   ?Colonoscopy is due - will do cologuard ?Discussed concerning signs and sx of CRC, pt denies  ? ?Lung cancer:   ?Low Dose CT Chest recommended if Age 42-80 years, 20 pack-year currently smoking OR have quit w/in 15years. Patient does not qualify.   ? ?Social History  ? ?Tobacco Use  ? Smoking status: Never  ? Smokeless tobacco: Never  ?Substance Use Topics  ? Alcohol use: No  ?  Alcohol/week: 0.0 standard drinks  ? Drug use: No  ?  ? ?Flowsheet Row Video Visit from 08/15/2019 in [facility]  ?AUDIT-C Score 0  ? ?  ? ? ?Family History  ?Problem Relation Age of Onset  ? Heart attack Mother   ? Cervical cancer Mother   ? Aneurysm Mother   ? Epilepsy Child   ?     [place]  ? Heart disease Paternal Grandmother   ?  ? ?Blood pressure/Hypertension: ?BP Readings from Last 3 Encounters:  ?04/07/21 122/66  ?07/04/19 118/80  ?08/24/16 124/76  ? ? ?Weight/Obesity: ?Wt Readings from Last 3 Encounters:  ?04/07/21 183 lb 8 oz (83.2 kg)  ?09/26/20 175 lb (79.4 kg)  ?08/15/19 179 lb (81.2 kg)  ? ?BMI Readings from Last 3 Encounters:  ?04/07/21 31.50 kg/m?  ?09/26/20 29.12 kg/m?  ?08/15/19 29.79 kg/m?  ?  ? ?Lipids:  ?Lab Results  ?Component Value Date  ? CHOL 169 03/20/2016  ? ?Lab Results  ?Component Value Date  ? HDL 67 03/20/2016  ? ?Lab Results  ?Component Value  Date  ? [place] 90 03/20/2016  ? ?Lab Results  ?Component Value Date  ? TRIG 62 03/20/2016  ? ?Lab Results  ?Component  Value Date  ? CHOLHDL 2.5 03/20/2016  ? ?No results found for: LDLDIRECT ?Based on the results of lipid panel his/her cardiovascular risk factor ( using [facility] )  in the next 10 years is: ?The ASCVD Risk score (Arnett DK, et al., 2019) failed to calculate for the following reasons: ?  Cannot find a previous HDL lab ?  Cannot find a previous total cholesterol lab ? ?Glucose:  ?Glucose, Bld  ?Date Value Ref Range Status  ?07/04/2019 90 65 - 99 mg/dL Final  ?  Comment:  ?  . ?           Fasting reference interval ?. ?  ?08/07/2016 50 (L) 65 - 99 mg/dL Final  ?03/20/2016 84 65 - 99 mg/dL Final  ? ? ?Advanced Care Planning:  ?A voluntary discussion about advance care planning including the explanation and discussion of advance directives.   ?Discussed health care proxy and Living will, and the patient was able to identify a health care proxy as husband russel Droege ?Patient does have a living will at present time.  ? ?Social History ?      ?Social History  ? ?Socioeconomic History  ? Marital status: Married  ?  Spouse name: Not on file  ? Number of children: 2  ? Years of education: Not on file  ? Highest education level: Not on file  ?Occupational History  ? Occupation: self employed  ?  Comment: secret shopper  ?Tobacco Use  ? Smoking status: Never  ? Smokeless tobacco: Never  ?Substance and Sexual Activity  ? Alcohol use: No  ?  Alcohol/week: 0.0 standard drinks  ? Drug use: No  ? Sexual activity: Yes  ?  Partners: Male  ?Other Topics Concern  ? Not on file  ?Social History Narrative  ? Not on file  ? ?Social Determinants of Health  ? ?Financial Resource Strain: Low Risk   ? Difficulty of Paying Living Expenses: Not hard at all  ?Food Insecurity: No Food Insecurity  ? Worried About Charity fundraiser in the Last Year: Never true  ? Ran Out of Food in the Last Year: Never true   ?Transportation Needs: No Transportation Needs  ? Lack of Transportation (Medical): No  ? Lack of Transportation (Non-Medical): No  ?Physical Activity: Sufficiently Active  ? Days of Exercise per Week: 6 days  ? Minutes

## 2021-04-08 LAB — CBC WITH DIFFERENTIAL/PLATELET
Absolute Monocytes: 429 cells/uL (ref 200–950)
Basophils Absolute: 58 cells/uL (ref 0–200)
Basophils Relative: 0.9 %
Eosinophils Absolute: 102 cells/uL (ref 15–500)
Eosinophils Relative: 1.6 %
HCT: 37.4 % (ref 35.0–45.0)
Hemoglobin: 12.2 g/dL (ref 11.7–15.5)
Lymphs Abs: 2925 cells/uL (ref 850–3900)
MCH: 30.3 pg (ref 27.0–33.0)
MCHC: 32.6 g/dL (ref 32.0–36.0)
MCV: 92.8 fL (ref 80.0–100.0)
MPV: 11.2 fL (ref 7.5–12.5)
Monocytes Relative: 6.7 %
Neutro Abs: 2886 cells/uL (ref 1500–7800)
Neutrophils Relative %: 45.1 %
Platelets: 297 10*3/uL (ref 140–400)
RBC: 4.03 10*6/uL (ref 3.80–5.10)
RDW: 12.9 % (ref 11.0–15.0)
Total Lymphocyte: 45.7 %
WBC: 6.4 10*3/uL (ref 3.8–10.8)

## 2021-04-08 LAB — HEPATITIS C ANTIBODY
Hepatitis C Ab: NONREACTIVE
SIGNAL TO CUT-OFF: 0.07 (ref <1.00)

## 2021-04-08 LAB — LIPID PANEL
Cholesterol: 196 mg/dL (ref <200)
HDL: 68 mg/dL (ref >=50)
LDL Cholesterol (Calc): 106 mg/dL (calc) — ABNORMAL HIGH
Non-HDL Cholesterol (Calc): 128 mg/dL (calc) (ref <130)
Total CHOL/HDL Ratio: 2.9 (calc) (ref <5.0)
Triglycerides: 122 mg/dL (ref <150)

## 2021-04-08 LAB — COMPLETE METABOLIC PANEL WITH GFR
AG Ratio: 1.6 (calc) (ref 1.0–2.5)
ALT: 8 U/L (ref 6–29)
AST: 14 U/L (ref 10–35)
Albumin: 4.3 g/dL (ref 3.6–5.1)
Alkaline phosphatase (APISO): 102 U/L (ref 37–153)
BUN: 12 mg/dL (ref 7–25)
CO2: 28 mmol/L (ref 20–32)
Calcium: 9.4 mg/dL (ref 8.6–10.4)
Chloride: 102 mmol/L (ref 98–110)
Creat: 0.74 mg/dL (ref 0.50–1.03)
Globulin: 2.7 g/dL (calc) (ref 1.9–3.7)
Glucose, Bld: 82 mg/dL (ref 65–99)
Potassium: 4.6 mmol/L (ref 3.5–5.3)
Sodium: 138 mmol/L (ref 135–146)
Total Bilirubin: 0.3 mg/dL (ref 0.2–1.2)
Total Protein: 7 g/dL (ref 6.1–8.1)
eGFR: 96 mL/min/{1.73_m2} (ref >=60)

## 2021-04-08 LAB — TSH: TSH: 1.58 mIU/L

## 2021-04-08 LAB — HIV ANTIBODY (ROUTINE TESTING W REFLEX): HIV 1&2 Ab, 4th Generation: NONREACTIVE

## 2021-04-11 ENCOUNTER — Encounter: Payer: Self-pay | Admitting: Family Medicine

## 2021-04-11 MED ORDER — SHINGRIX 50 MCG/0.5ML IM SUSR
0.5000 mL | Freq: Once | INTRAMUSCULAR | 1 refills | Status: AC
Start: 2021-04-11 — End: 2021-04-11

## 2021-04-11 MED ORDER — PREMARIN 0.625 MG/GM VA CREA: TOPICAL_CREAM | VAGINAL | 12 refills | Status: DC

## 2021-05-17 LAB — COLOGUARD: COLOGUARD: NEGATIVE

## 2021-10-23 ENCOUNTER — Ambulatory Visit: Payer: Managed Care, Other (non HMO) | Admitting: Family Medicine

## 2021-11-03 ENCOUNTER — Other Ambulatory Visit (HOSPITAL_BASED_OUTPATIENT_CLINIC_OR_DEPARTMENT_OTHER): Payer: Self-pay | Admitting: Family Medicine

## 2021-11-03 DIAGNOSIS — Z1231 Encounter for screening mammogram for malignant neoplasm of breast: Secondary | ICD-10-CM

## 2021-11-06 ENCOUNTER — Ambulatory Visit (HOSPITAL_BASED_OUTPATIENT_CLINIC_OR_DEPARTMENT_OTHER)
Admission: RE | Admit: 2021-11-06 | Discharge: 2021-11-06 | Disposition: A | Discharge status: Home / Self Care | Payer: Managed Care, Other (non HMO) | Source: Ambulatory Visit | Attending: Family Medicine | Admitting: Family Medicine

## 2021-11-06 DIAGNOSIS — Z1231 Encounter for screening mammogram for malignant neoplasm of breast: Secondary | ICD-10-CM | POA: Diagnosis present

## 2021-11-24 ENCOUNTER — Ambulatory Visit: Payer: Self-pay | Admitting: *Deleted

## 2021-11-24 ENCOUNTER — Ambulatory Visit
Admission: RE | Admit: 2021-11-24 | Discharge: 2021-11-24 | Disposition: A | Discharge status: Home / Self Care | Payer: Managed Care, Other (non HMO) | Source: Ambulatory Visit | Attending: Family Medicine | Admitting: Family Medicine

## 2021-11-24 DIAGNOSIS — Z780 Asymptomatic menopausal state: Secondary | ICD-10-CM | POA: Diagnosis present

## 2021-11-24 NOTE — Telephone Encounter (Signed)
  Chief Complaint: dizziness  Symptoms: dizziness when laying flat room spins, right ear noted with "a second " of pain and now gone. BP 128/86. Dizziness with movement not severe. Can walk. Frequency: couple of days  Pertinent Negatives: Patient denies chest pain no difficulty breathing no fever, no cold sx.  Disposition: [] ED /[] Urgent Care (no appt availability in office) / [x] Appointment(In office/virtual)/ []  [place] [facility]/ [] Home Care/ [] Refused Recommended Disposition /[] [place] [facility]/ []  Follow-up with PCP Additional Notes:   Appt tomorrow .   Reason for Disposition  [1] MODERATE dizziness (e.g., interferes with normal activities) AND [2] has NOT been evaluated by doctor (or NP/PA) for this  (Exception: Dizziness caused by heat exposure, sudden standing, or poor fluid intake.)  Answer Assessment - Initial Assessment Questions 1. DESCRIPTION: "Describe your dizziness."     With movement  2. LIGHTHEADED: "Do you feel lightheaded?" (e.g., somewhat faint, woozy, weak upon standing)     Riding in care . Dizzy when standing  3. VERTIGO: "Do you feel like either you or the room is spinning or tilting?" (i.e. vertigo)     Room spins when laying flat  4. SEVERITY: "How bad is it?"  "Do you feel like you are going to faint?" "Can you stand and walk?"   - MILD: Feels slightly dizzy, but walking normally.   - MODERATE: Feels unsteady when walking, but not falling; interferes with normal activities (e.g., school, work).   - SEVERE: Unable to walk without falling, or requires assistance to walk without falling; feels like passing out now.      Mild  5. ONSET:  "When did the dizziness begin?"     Couple of days  6. AGGRAVATING FACTORS: "Does anything make it worse?" (e.g., standing, change in head position)     Change in head position  7. HEART RATE: "Can you tell me your heart rate?" "How many beats in 15 seconds?"  (Note: not all patients can do this)       na 8. CAUSE:  "What do you think is causing the dizziness?"     Not sure  9. RECURRENT SYMPTOM: "Have you had dizziness before?" If Yes, ask: "When was the last time?" "What happened that time?"     na 10. OTHER SYMPTOMS: "Do you have any other symptoms?" (e.g., fever, chest pain, vomiting, diarrhea, bleeding)       Right ear pain for a second but none since  11. PREGNANCY: "Is there any chance you are pregnant?" "When was your last menstrual period?"       na  Protocols used: Dizziness - Lightheadedness-A-AH

## 2021-11-24 NOTE — Telephone Encounter (Signed)
Attempted to return her call.   Left a voicemail to call back. 

## 2021-11-24 NOTE — Telephone Encounter (Signed)
Third attempt to return her call without success.   Per policy I have forwarded her chart to [facility].

## 2021-11-24 NOTE — Telephone Encounter (Signed)
Second attempt to return her call.   Voicemail left to call back.

## 2021-11-24 NOTE — Telephone Encounter (Signed)
Message from Glean Salen sent at 11/24/2021  3:11 PM EST  Summary: dizziness   Patient called in with dizziness.          Call History   Type Contact Phone/Fax User  11/24/2021 03:10 PM EST Phone (Incoming) Mindy Jenkins, Mindy Jenkins (Self) [phone] Judie Petit) Eliseo Gum, Deedra Ehrich

## 2021-11-25 ENCOUNTER — Ambulatory Visit: Payer: Managed Care, Other (non HMO) | Admitting: Family Medicine

## 2021-12-03 ENCOUNTER — Encounter: Payer: Self-pay | Admitting: Nurse Practitioner

## 2021-12-03 ENCOUNTER — Telehealth (INDEPENDENT_AMBULATORY_CARE_PROVIDER_SITE_OTHER): Payer: Managed Care, Other (non HMO) | Admitting: Nurse Practitioner

## 2021-12-03 DIAGNOSIS — M810 Age-related osteoporosis without current pathological fracture: Secondary | ICD-10-CM | POA: Insufficient documentation

## 2021-12-03 DIAGNOSIS — M8000XG Age-related osteoporosis with current pathological fracture, unspecified site, subsequent encounter for fracture with delayed healing: Secondary | ICD-10-CM | POA: Diagnosis not present

## 2021-12-03 HISTORY — DX: Age-related osteoporosis without current pathological fracture: M81.0

## 2021-12-03 MED ORDER — ALENDRONATE SODIUM 70 MG PO TABS: 70.0000 mg | ORAL_TABLET | ORAL | 11 refills | Status: DC

## 2021-12-03 NOTE — Assessment & Plan Note (Signed)
Recommend taking vitamin d 2000 units daily and calcium citrate 250 mg two times a day.  Patient would like to start fosamax discussed how to take medication and side effects.  Prescription sent in. Also ordered labs for patient to come by and get drawn, vitamin d and parathyroid.

## 2021-12-03 NOTE — Progress Notes (Signed)
Name: Mindy Jenkins   MRN: [id number]    DOB: 01-21-1965   Date:12/03/2021       Progress Note  Subjective  Chief Complaint  No chief complaint on file.   I connected with  Mindy Jenkins  on 12/03/21 at  2:40 PM EST by a video enabled telemedicine application and verified that I am speaking with the correct person using two identifiers.  I discussed the limitations of evaluation and management by telemedicine and the availability of in person appointments. The patient expressed understanding and agreed to proceed with a virtual visit  Staff also discussed with the patient that there may be a patient responsible charge related to this service. Patient Location: home Provider Location: [facility] Additional Individuals present: alone  HPI  Osteoporosis: Patient fractured her foot two years ago after a fall then refractured it helping move some boxes.  She says she has been in a boot for 7 months and it has not healed.   Patient had bone density done on 11/24/2021.   It showed :FRAX* RESULTS:  (version: 3.5) 10-year Probability of Fracture1 Major Osteoporotic Fracture2 Hip Fracture 15.9% 3.1% Population: [place] (Caucasian) Risk Factors: History of Fracture (Adult)   Based on Femur (Left) Neck BMD   1 -The 10-year probability of fracture may be lower than reported if the patient has received treatment. 2 -Major Osteoporotic Fracture: Clinical Spine, Forearm, Hip or Shoulder  Discussed with patient that she is diagnosed with osteoporosis.  Discussed would like to get labs that include vitamin d level and parathyroid.  She is currently taking vitamin d supplement and calcium supplement but unsure how much. Recommend increasing to 2000 units daily and calcium citrate 250 mg BID.  Also discussed Fosamax treatment.  Discussed side effects and how to take medication.  Patient has decided to take the medication.  Patient Active Problem List   Diagnosis Date Noted   Osteoporosis  12/03/2021   Anxiety disorder 07/04/2019   Moderate episode of recurrent major depressive disorder (HCC) 07/04/2019   Chronic midline back pain 07/04/2019   Family history of ankylosing spondylitis 07/04/2019   History of cervical spinal surgery 07/04/2019   Attention deficit hyperactivity disorder (ADHD) 07/04/2019   Insomnia due to other mental disorder 07/04/2019   Chronic right hip pain 09/25/2014   Choledocholithiasis 06/28/2014   Obesity, Class I, BMI 30-34.9 06/20/2014   Intractable chronic migraine without aura and without status migrainosus 04/18/2014   History of fusion of cervical spine 09/15/2012    Social History   Tobacco Use   Smoking status: Never   Smokeless tobacco: Never  Substance Use Topics   Alcohol use: No    Alcohol/week: 0.0 standard drinks of alcohol     Current Outpatient Medications:    alendronate (FOSAMAX) 70 MG tablet, Take 1 tablet (70 mg total) by mouth every 7 (seven) days. Take with a full glass of water on an empty stomach., Disp: 4 tablet, Rfl: 11   buPROPion (WELLBUTRIN XL) 300 MG 24 hr tablet, Take 300 mg by mouth every morning., Disp: , Rfl:    celecoxib (CELEBREX) 100 MG capsule, Take 1 capsule by mouth 2 (two) times daily., Disp: , Rfl:    cholecalciferol (VITAMIN D3) 25 MCG (1000 UNIT) tablet, Take 1,000 Units by mouth daily., Disp: , Rfl:    conjugated estrogens (PREMARIN) vaginal cream, Place 1 applicator daily for 2 weeks and then use 2 times weekly thereafter for atrophic vaginitis and dyspareunia, Disp: 42.5 g, Rfl: 12  dexmethylphenidate (FOCALIN) 5 MG tablet, Take 5 mg by mouth 2 (two) times daily., Disp: , Rfl:    lisdexamfetamine (VYVANSE) 40 MG capsule, Take 1 capsule by mouth every morning., Disp: , Rfl:    Multiple Vitamin (MULTIVITAMIN) capsule, Take 1 capsule by mouth daily., Disp: , Rfl:   No Known Allergies  I personally reviewed active problem list, medication list, allergies, notes from last encounter with the  patient/caregiver today.  ROS  Constitutional: Negative for fever or weight change.  Respiratory: Negative for cough and shortness of breath.   Cardiovascular: Negative for chest pain or palpitations.  Gastrointestinal: Negative for abdominal pain, no bowel changes.  Musculoskeletal: Negative for gait problem or joint swelling.  Skin: Negative for rash.  Neurological: Negative for dizziness or headache.  No other specific complaints in a complete review of systems (except as listed in HPI above).   Objective  Virtual encounter, vitals not obtained.  There is no height or weight on file to calculate BMI.  Nursing Note and Vital Signs reviewed.  Physical Exam  Awake, alert and oriented speaking in complete sentences  No results found for this or any previous visit (from the past 72 hour(s)).  Assessment & Plan  Problem List Items Addressed This Visit       Musculoskeletal and Integument   Osteoporosis - Primary    Recommend taking vitamin d 2000 units daily and calcium citrate 250 mg two times a day.  Patient would like to start fosamax discussed how to take medication and side effects.  Prescription sent in. Also ordered labs for patient to come by and get drawn, vitamin d and parathyroid.       Relevant Medications   alendronate (FOSAMAX) 70 MG tablet   Other Relevant Orders   Vitamin D (25 hydroxy)   Parathyroid hormone, intact (no Ca)     -Red flags and when to present for emergency care or RTC including fever >101.13F, chest pain, shortness of breath, new/worsening/un-resolving symptoms,  reviewed with patient at time of visit. Follow up and care instructions discussed and provided in AVS. - I discussed the assessment and treatment plan with the patient. The patient was provided an opportunity to ask questions and all were answered. The patient agreed with the plan and demonstrated an understanding of the instructions.  I provided 20 minutes of non-face-to-face time  during this encounter.  Berniece Salines, FNP

## 2021-12-09 LAB — PARATHYROID HORMONE, INTACT (NO CA): PTH: 54 pg/mL (ref 16–77)

## 2021-12-09 LAB — VITAMIN D 25 HYDROXY (VIT D DEFICIENCY, FRACTURES): Vit D, 25-Hydroxy: 26 ng/mL — ABNORMAL LOW (ref 30–100)

## 2023-01-01 DIAGNOSIS — F909 Attention-deficit hyperactivity disorder, unspecified type: Secondary | ICD-10-CM | POA: Diagnosis not present

## 2023-01-01 DIAGNOSIS — F339 Major depressive disorder, recurrent, unspecified: Secondary | ICD-10-CM | POA: Diagnosis not present

## 2023-03-16 DIAGNOSIS — F909 Attention-deficit hyperactivity disorder, unspecified type: Secondary | ICD-10-CM | POA: Diagnosis not present

## 2023-03-16 DIAGNOSIS — F339 Major depressive disorder, recurrent, unspecified: Secondary | ICD-10-CM | POA: Diagnosis not present

## 2023-03-25 ENCOUNTER — Other Ambulatory Visit: Payer: Self-pay

## 2023-03-25 ENCOUNTER — Ambulatory Visit
Admission: RE | Admit: 2023-03-25 | Discharge: 2023-03-25 | Disposition: A | Discharge status: Home / Self Care | Payer: Self-pay | Source: Ambulatory Visit | Attending: Emergency Medicine | Admitting: Emergency Medicine

## 2023-03-25 VITALS — BP 158/96 | HR 67 | Temp 98.0°F | Resp 18

## 2023-03-25 DIAGNOSIS — M546 Pain in thoracic spine: Secondary | ICD-10-CM

## 2023-03-25 MED ORDER — KETOROLAC TROMETHAMINE 30 MG/ML IJ SOLN
30.0000 mg | Freq: Once | INTRAMUSCULAR | Status: AC
  Administered 2023-03-25: 30 mg via INTRAMUSCULAR

## 2023-03-25 MED ORDER — CYCLOBENZAPRINE HCL 10 MG PO TABS: 10.0000 mg | ORAL_TABLET | Freq: Two times a day (BID) | ORAL | 0 refills | Status: DC | PRN

## 2023-03-25 MED ORDER — PREDNISONE 20 MG PO TABS: 40.0000 mg | ORAL_TABLET | Freq: Every day | ORAL | 0 refills | Status: DC

## 2023-03-25 NOTE — ED Triage Notes (Signed)
 Patient presents to [facility] for evaluation of back pain between her shoulder blades that started a few hours after she used the hand grip in the car to force her body to look right (since she can't fully turn her neck due to a fusion of C4-6).  Says it has been spasming, and is much worse with movement.

## 2023-03-25 NOTE — Discharge Instructions (Signed)
 Your pain is most likely caused by irritation to the muscles.  Injection of Toradol to help reduce inflammation and help with pain and ideally will see improvement within the next 30 minutes to an hour  Starting tomorrow take prednisone every morning with food as directed, may use Tylenol or any topical medicines additionally  May use muscle relaxant twice daily for additional comfort, please be mindful this can make you feel drowsy  You may use heating pad in 15 minute intervals as needed for additional comfort, or  you may find comfort in using ice in 10-15 minutes over affected area  Begin massaging and stretching affected area daily for 10 minutes as tolerated to further loosen muscles, do so as pain lessens and activity becomes more tolerable  When sitting and lying down place pillow underneath and between knees for support  Can try sleeping without pillow on firm mattress   Practice good posture: head back, shoulders back, chest forward, pelvis back and weight distributed evenly on both legs  If pain persist after recommended treatment or reoccurs if may be beneficial to follow up with orthopedic specialist for evaluation, this doctor specializes in the bones and can manage your symptoms long-term with options such as but not limited to imaging, medications or physical therapy

## 2023-03-25 NOTE — ED Provider Notes (Signed)
 Mindy Jenkins    CSN: [id number] Arrival date & time: 03/25/23  1155      History   Chief Complaint Chief Complaint  Patient presents with   Back Pain    Entered by patient    HPI Mindy Jenkins is a 58 y.o. female.   Patient presents for evaluation of spasming between the shoulder blades present for 4 days.  Endorses that she quickly turned the entire body when attempting to look past her shoulder while in a car and believes this caused pain.,  Pain has been sharp and shooting since.  Believes to be muscular, described as a stiffness making it difficult to complete range of motion.  Has attempted use of Epson soaks, he heating pad and Advil which while has been helpful has not resolved symptoms.  History of spinal fusion to the cervical region.  Denies numbness or tingling.  Past Medical History:  Diagnosis Date   Age-related osteoporosis without current pathological fracture 12/03/2021   Anemia    H/O   Anxiety disorder 07/04/2019   Migraine without aura, with intractable migraine, so stated, without mention of status migrainosus    Moderate episode of recurrent major depressive disorder (HCC) 07/04/2019    Patient Active Problem List   Diagnosis Date Noted   Osteoporosis 12/03/2021   Anxiety disorder 07/04/2019   Moderate episode of recurrent major depressive disorder (HCC) 07/04/2019   Chronic midline back pain 07/04/2019   Family history of ankylosing spondylitis 07/04/2019   History of cervical spinal surgery 07/04/2019   Attention deficit hyperactivity disorder (ADHD) 07/04/2019   Insomnia due to other mental disorder 07/04/2019   Chronic right hip pain 09/25/2014   Choledocholithiasis 06/28/2014   Obesity, Class I, BMI 30-34.9 06/20/2014   Intractable chronic migraine without aura and without status migrainosus 04/18/2014   History of fusion of cervical spine 09/15/2012    Past Surgical History:  Procedure Laterality Date   BOTOX INJECTION N/A     Dr. Anne Hahn performs these throughout her scalp. Approx 25 injections.   CERVICAL FUSION  05/06/2012   CHOLECYSTECTOMY N/A 07/25/2014   Procedure: LAPAROSCOPIC CHOLECYSTECTOMY WITH INTRAOPERATIVE CHOLANGIOGRAM;  Surgeon: Earline Mayotte, MD;  Location: [facility] ORS;  Service: General;  Laterality: N/A;    OB History     Gravida  2   Para  2   Term      Preterm      AB      Living         SAB      IAB      Ectopic      Multiple      Live Births           Obstetric Comments  1st Menstrual Cycle:  13  1st Pregnancy:  27          Home Medications    Prior to Admission medications   Medication Sig Start Date End Date Taking? Authorizing Provider  cyclobenzaprine (FLEXERIL) 10 MG tablet Take 1 tablet (10 mg total) by mouth 2 (two) times daily as needed for muscle spasms. 03/25/23  Yes Neri Vieyra R, NP  predniSONE (DELTASONE) 20 MG tablet Take 2 tablets (40 mg total) by mouth daily. 03/25/23  Yes Shahil Speegle, Elita Boone, NP  alendronate (FOSAMAX) 70 MG tablet Take 1 tablet (70 mg total) by mouth every 7 (seven) days. Take with a full glass of water on an empty stomach. 12/03/21   Berniece Salines, FNP  buPROPion [facility]  XL) 300 MG 24 hr tablet Take 300 mg by mouth every morning. 03/28/21   [provider]  celecoxib (CELEBREX) 100 MG capsule Take 1 capsule by mouth 2 (two) times daily. 11/17/21   [provider]  cholecalciferol (VITAMIN D3) 25 MCG (1000 UNIT) tablet Take 1,000 Units by mouth daily.    [provider]  conjugated estrogens (PREMARIN) vaginal cream Place 1 applicator daily for 2 weeks and then use 2 times weekly thereafter for atrophic vaginitis and dyspareunia 04/11/21   Danelle Berry, PA-C  dexmethylphenidate (FOCALIN) 5 MG tablet Take 5 mg by mouth 2 (two) times daily. 03/29/21   [provider]  lisdexamfetamine (VYVANSE) 40 MG capsule Take 1 capsule by mouth every morning.    [provider]  Multiple Vitamin  (MULTIVITAMIN) capsule Take 1 capsule by mouth daily.    [provider]    Family History Family History  Problem Relation Age of Onset   Heart attack Mother    Cervical cancer Mother    Aneurysm Mother    Epilepsy Child        [place]   Heart disease Paternal Grandmother     Social History Social History   Tobacco Use   Smoking status: Never   Smokeless tobacco: Never  Substance Use Topics   Alcohol use: No    Alcohol/week: 0.0 standard drinks of alcohol   Drug use: No     Allergies   Patient has no known allergies.   Review of Systems Review of Systems   Physical Exam Triage Vital Signs ED Triage Vitals  Encounter Vitals Group     BP 03/25/23 1226 (!) 158/96     Systolic BP Percentile --      Diastolic BP Percentile --      Pulse Rate 03/25/23 1226 67     Resp 03/25/23 1226 18     Temp 03/25/23 1226 98 F (36.7 C)     Temp Source 03/25/23 1226 Temporal     SpO2 03/25/23 1226 96 %     Weight --      Height --      Head Circumference --      Peak Flow --      Pain Score 03/25/23 1227 3     Pain Loc --      Pain Education --      Exclude from Growth Chart --    No data found.  Updated Vital Signs BP (!) 158/96 (BP Location: Left Arm)   Pulse 67   Temp 98 F (36.7 C) (Temporal)   Resp 18   SpO2 96%   Visual Acuity Right Eye Distance:   Left Eye Distance:   Bilateral Distance:    Right Eye Near:   Left Eye Near:    Bilateral Near:     Physical Exam Constitutional:      Appearance: Normal appearance.  Eyes:     Extraocular Movements: Extraocular movements intact.  Pulmonary:     Effort: Pulmonary effort is normal.  Musculoskeletal:     Comments: tenderness to the right thoracic region of the back parallel to the spinal cord without spinal tenderness, no ecchymosis swelling or deformity, patient visibly uncomfortable, sitting with shoulders lifted, able to twist turn and bend but pain is elicited with movement  Neurological:      Mental Status: She is alert and oriented to person, place, and time. Mental status is at baseline.      UC Treatments /  Results  Labs (all labs ordered are listed, but only abnormal results are displayed) Labs Reviewed - No data to display  EKG   Radiology No results found.  Procedures Procedures (including critical care time)  Medications Ordered in UC Medications  ketorolac (TORADOL) 30 MG/ML injection 30 mg (30 mg Intramuscular Given 03/25/23 1249)    Initial Impression / Assessment and Plan / UC Course  I have reviewed the triage vital signs and the nursing notes.  Pertinent labs & imaging results that were available during my care of the patient were reviewed by me and considered in my medical decision making (see chart for details).  Acute right-sided thoracic back pain  Etiology most likely muscular, no spinal tenderness noted on exam, no direct injury therefore deferring imaging, discussed, Toradol IM given and prescribed prednisone and Flexeril for home use recommended RICE, heat massage stretching with activity as tolerated and walker referral given to orthopedics Final Clinical Impressions(s) / UC Diagnoses   Final diagnoses:  Acute right-sided thoracic back pain     Discharge Instructions      Your pain is most likely caused by irritation to the muscles.  Injection of Toradol to help reduce inflammation and help with pain and ideally will see improvement within the next 30 minutes to an hour  Starting tomorrow take prednisone every morning with food as directed, may use Tylenol or any topical medicines additionally  May use muscle relaxant twice daily for additional comfort, please be mindful this can make you feel drowsy  You may use heating pad in 15 minute intervals as needed for additional comfort, or  you may find comfort in using ice in 10-15 minutes over affected area  Begin massaging and stretching affected area daily for 10 minutes as  tolerated to further loosen muscles, do so as pain lessens and activity becomes more tolerable  When sitting and lying down place pillow underneath and between knees for support  Can try sleeping without pillow on firm mattress   Practice good posture: head back, shoulders back, chest forward, pelvis back and weight distributed evenly on both legs  If pain persist after recommended treatment or reoccurs if may be beneficial to follow up with orthopedic specialist for evaluation, this doctor specializes in the bones and can manage your symptoms long-term with options such as but not limited to imaging, medications or physical therapy      ED Prescriptions     Medication Sig Dispense Auth. Provider   predniSONE (DELTASONE) 20 MG tablet Take 2 tablets (40 mg total) by mouth daily. 10 tablet Axel Frisk, Hansel Starling R, NP   cyclobenzaprine (FLEXERIL) 10 MG tablet Take 1 tablet (10 mg total) by mouth 2 (two) times daily as needed for muscle spasms. 20 tablet Valinda Hoar, NP      PDMP not reviewed this encounter.   Valinda Hoar, NP 03/25/23 1325

## 2023-07-20 DIAGNOSIS — F909 Attention-deficit hyperactivity disorder, unspecified type: Secondary | ICD-10-CM | POA: Diagnosis not present

## 2023-07-20 DIAGNOSIS — F339 Major depressive disorder, recurrent, unspecified: Secondary | ICD-10-CM | POA: Diagnosis not present

## 2023-09-14 DIAGNOSIS — F339 Major depressive disorder, recurrent, unspecified: Secondary | ICD-10-CM | POA: Diagnosis not present

## 2023-09-14 DIAGNOSIS — F909 Attention-deficit hyperactivity disorder, unspecified type: Secondary | ICD-10-CM | POA: Diagnosis not present

## 2023-09-20 ENCOUNTER — Ambulatory Visit: Admitting: Family Medicine

## 2023-09-20 ENCOUNTER — Encounter: Payer: Self-pay | Admitting: Family Medicine

## 2023-09-20 VITALS — BP 132/76 | HR 68 | Resp 16 | Ht 64.0 in | Wt 178.0 lb

## 2023-09-20 DIAGNOSIS — Z780 Asymptomatic menopausal state: Secondary | ICD-10-CM

## 2023-09-20 DIAGNOSIS — R232 Flushing: Secondary | ICD-10-CM

## 2023-09-20 DIAGNOSIS — N951 Menopausal and female climacteric states: Secondary | ICD-10-CM

## 2023-09-20 DIAGNOSIS — M8000XG Age-related osteoporosis with current pathological fracture, unspecified site, subsequent encounter for fracture with delayed healing: Secondary | ICD-10-CM | POA: Diagnosis not present

## 2023-09-20 DIAGNOSIS — F909 Attention-deficit hyperactivity disorder, unspecified type: Secondary | ICD-10-CM

## 2023-09-20 DIAGNOSIS — R5383 Other fatigue: Secondary | ICD-10-CM

## 2023-09-20 DIAGNOSIS — Z5181 Encounter for therapeutic drug level monitoring: Secondary | ICD-10-CM

## 2023-09-20 DIAGNOSIS — Z1231 Encounter for screening mammogram for malignant neoplasm of breast: Secondary | ICD-10-CM

## 2023-09-20 DIAGNOSIS — Z789 Other specified health status: Secondary | ICD-10-CM

## 2023-09-20 DIAGNOSIS — G4700 Insomnia, unspecified: Secondary | ICD-10-CM

## 2023-09-20 DIAGNOSIS — G43719 Chronic migraine without aura, intractable, without status migrainosus: Secondary | ICD-10-CM

## 2023-09-20 DIAGNOSIS — D649 Anemia, unspecified: Secondary | ICD-10-CM | POA: Diagnosis not present

## 2023-09-20 DIAGNOSIS — L659 Nonscarring hair loss, unspecified: Secondary | ICD-10-CM

## 2023-09-20 DIAGNOSIS — Z79890 Hormone replacement therapy (postmenopausal): Secondary | ICD-10-CM

## 2023-09-20 MED ORDER — CYCLOBENZAPRINE HCL 10 MG PO TABS: 5.0000 mg | ORAL_TABLET | Freq: Three times a day (TID) | ORAL | 2 refills | Status: AC | PRN

## 2023-09-20 NOTE — Progress Notes (Signed)
 Patient ID: [name], female    DOB: April 19, 1965, 58 y.o.   MRN: [id number]  PCP: Leavy Mole, PA-C  Chief Complaint  Patient presents with   Hormone Changes    Gets hot easily, weight gain   Fatigue   Alopecia    Subjective:   Mindy Jenkins is a 58 y.o. female, presents to clinic with CC of the following:  HPI   Hx of anemia - doesn't eat meat  Osteoporosis - supplements walking weight bearing Reviewed last dexa and it is osteopenia   Menopause 10 years ago 2015 Migraines - improved 10 years ago  Multiple complaints and concerns today, brain fog, decreased energy, joint pain, hot flashes/sweats, hair loss Discussed the use of AI scribe software for clinical note transcription with the patient, who gave verbal consent to proceed.  History of Present Illness Mindy Jenkins is a 58 year old female who presents with hair loss and sleep disturbances.  Diffuse hair loss - Significant diffuse hair loss over the past few years, primarily affecting the frontal scalp - Describes hair loss as severe, with a sensation of balding - No prior treatments attempted for hair loss  Sleep disturbance and insomnia - Chronic insomnia with inability to maintain sleep for several years - Wakes nightly, remaining awake for 30 minutes to two hours - Able to fall asleep easily but unable to stay asleep - Nocturnal awakenings attributed in part to stress and family/job-related factors over the past two years - No restorative sleep in years  Menopausal symptoms - Menopause since approximately 2015 - Experiences hot flashes and palpitations - Insomnia associated with menopausal transition  Fatigue and myalgia - Persistent fatigue - Muscle tightness, particularly in the upper back - Manages muscle tightness with daily stretching and occasional use of muscle relaxers  Raynaud's phenomenon - Fingertips turn white and cold, especially in cold conditions - Symptoms  present for a couple of years - No significant impact on daily activities  Nutritional status and supplementation - Vitamin D  deficiency with most recent level of 26 - Takes a multivitamin with extra vitamin D  for women over 50 (exact dosage unknown) - Takes an additional vitamin D  supplement (dosage unknown) - Follows a mostly vegetarian diet with tofu, soy, beans, and occasional chicken; no red meat - Typically low iron levels but not anemic  Physical activity and bone health - Active lifestyle with over 10,000 steps daily - Recently incorporated more weight-bearing exercises to address osteopenia   Epworth Sleepiness Scale:  Sitting and reading: Slight chance of dozing Watching TV: Would never doze Sitting, inactive in a public place (e.g. a theatre or a meeting): Would never doze As a passenger in a car for an hour without a break: Would never doze Lying down to rest in the afternoon when circumstances permit: Slight chance of dozing Sitting and talking to someone: Would never doze Sitting quietly after a lunch without alcohol: Would never doze In a car, while stopped for a few minutes in traffic: Would never doze  Total score: 2     Patient Active Problem List   Diagnosis Date Noted   Osteoporosis 12/03/2021   Anxiety disorder 07/04/2019   Moderate episode of recurrent major depressive disorder (HCC) 07/04/2019   Chronic midline back pain 07/04/2019   Family history of ankylosing spondylitis 07/04/2019   History of cervical spinal surgery 07/04/2019   Attention deficit hyperactivity disorder (ADHD) 07/04/2019   Insomnia due to other mental disorder 07/04/2019  Chronic right hip pain 09/25/2014   Choledocholithiasis 06/28/2014   Obesity, Class I, BMI 30-34.9 06/20/2014   Intractable chronic migraine without aura and without status migrainosus 04/18/2014   History of fusion of cervical spine 09/15/2012      Current Outpatient Medications:    buPROPion   (WELLBUTRIN  XL) 300 MG 24 hr tablet, Take 300 mg by mouth every morning., Disp: , Rfl:    lisdexamfetamine (VYVANSE) 40 MG capsule, Take 1 capsule by mouth every morning., Disp: , Rfl:    magnesium  gluconate (MAGONATE) 500 (27 Mg) MG TABS tablet, Take 500 mg by mouth at bedtime., Disp: , Rfl:    Multiple Vitamin (MULTIVITAMIN) capsule, Take 1 capsule by mouth daily., Disp: , Rfl:    No Known Allergies   Social History   Tobacco Use   Smoking status: Never   Smokeless tobacco: Never  Substance Use Topics   Alcohol use: No    Alcohol/week: 0.0 standard drinks of alcohol   Drug use: No      Chart Review Today: I personally reviewed active problem list, medication list, allergies, family history, social history, health maintenance, notes from last encounter, lab results, imaging with the patient/caregiver today.   Review of Systems  Constitutional: Negative.   HENT: Negative.    Eyes: Negative.   Respiratory: Negative.    Cardiovascular: Negative.   Gastrointestinal: Negative.   Endocrine: Negative.   Genitourinary: Negative.   Musculoskeletal: Negative.   Skin: Negative.   Allergic/Immunologic: Negative.   Neurological: Negative.   Hematological: Negative.   Psychiatric/Behavioral: Negative.    All other systems reviewed and are negative.      Objective:   Vitals:   09/20/23 0820  BP: 132/76  Pulse: 68  Resp: 16  SpO2: 96%  Weight: 178 lb (80.7 kg)  Height: 5' 4 (1.626 m)    Body mass index is 30.55 kg/m.  Physical Exam Vitals and nursing note reviewed.  Constitutional:      General: She is not in acute distress.    Appearance: Normal appearance. She is well-developed, well-groomed and overweight. She is not ill-appearing, toxic-appearing or diaphoretic.  HENT:     Head: Normocephalic and atraumatic.     Right Ear: External ear normal.     Left Ear: External ear normal.     Nose: Nose normal.  Eyes:     General: No scleral icterus.       Right eye:  No discharge.        Left eye: No discharge.     Conjunctiva/sclera: Conjunctivae normal.  Neck:     Trachea: No tracheal deviation.  Cardiovascular:     Rate and Rhythm: Normal rate.  Pulmonary:     Effort: Pulmonary effort is normal. No respiratory distress.     Breath sounds: No stridor.  Skin:    General: Skin is warm and dry.     Findings: No rash.  Neurological:     Mental Status: She is alert.     Motor: No abnormal muscle tone.     Coordination: Coordination normal.     Gait: Gait normal.  Psychiatric:        Mood and Affect: Mood normal.        Behavior: Behavior normal. Behavior is cooperative.      Results for orders placed or performed in visit on 12/03/21  Vitamin D  (25 hydroxy)   Collection Time: 12/08/21 11:32 AM  Result Value Ref Range   Vit D, 25-Hydroxy 26 (L) 30 -  100 ng/mL  Parathyroid  hormone, intact (no Ca)   Collection Time: 12/08/21 11:32 AM  Result Value Ref Range   PTH 54 16 - 77 pg/mL       Assessment & Plan:   1. Other fatigue (Primary) Multiple sx and wide ddx R/o electrolyte abnormality, anemia, deficiencies, hypothyroid, insomnia likely also a factor - Comprehensive Metabolic Panel (CMET) - CBC with Differential/Platelet - Vitamin D  (25 hydroxy) - Thyroid  Panel With TSH - Iron, TIBC and Ferritin Panel - Ambulatory referral to Pulmonology  2. Hot flashes May be menopausal sx?  3. Anemia, unspecified type  - CBC with Differential/Platelet - Vitamin B12 - Iron, TIBC and Ferritin Panel  4. Vegetarian  - CBC with Differential/Platelet - Vitamin B12 - Iron, TIBC and Ferritin Panel  5. Hair loss Discussed normal hair loss with decreased hormones, iron deficiency and hypothyroid causes (r/o with labs today) and other causes of alopecia.  She has fine thin hair, no bald spots or scalp lesions/plaques Nonscarring hair loss Reports significant hair loss, particularly in the frontal region, worsening over the past few years.  Potential causes include estrogen decline, iron deficiency, and hypothyroidism. Follows a diet low in red meat, which may contribute to low iron levels. - Order iron panel and thyroid  function tests to rule out deficiencies. - Consider hormone replacement therapy as a potential treatment for hair loss. - Comprehensive Metabolic Panel (CMET) - CBC with Differential/Platelet - Thyroid  Panel With TSH - Iron, TIBC and Ferritin Panel  6. Insomnia, unspecified type worse couple years, able to fall asleep 11pm, cannot stay asleep wakes at least 2x a night  - Ambulatory referral to Pulmonology  7. Age-related osteoporosis with current pathological fracture with delayed healing, subsequent encounter Osteopenia on last Dexa - due Nov Osteopenia Osteopenia, currently not on any specific medication. Maintains an active lifestyle with over 10,000 steps daily and is incorporating more weight-bearing exercises. Importance of weight-bearing exercises to prevent further bone density loss emphasized. Pt to tell me Vit D amount when she gets home - Order bone density scan in November. - Encourage continuation and increase of weight-bearing exercises. - Comprehensive Metabolic Panel (CMET) - CBC with Differential/Platelet  8. Intractable chronic migraine without aura and without status migrainosus Hx of, stopped having sx about 10 years ago  9. Attention deficit hyperactivity disorder (ADHD), unspecified ADHD type   10. Encounter for medication monitoring  - Lipid Profile - Comprehensive Metabolic Panel (CMET) - CBC with Differential/Platelet - Vitamin D  (25 hydroxy) - cyclobenzaprine  (FLEXERIL ) 10 MG tablet; Take 0.5-1 tablets (5-10 mg total) by mouth 3 (three) times daily as needed for muscle spasms.  Dispense: 60 tablet; Refill: 2 - Vitamin B12 - Thyroid  Panel With TSH - Iron, TIBC and Ferritin Panel  11. Encounter for screening mammogram for malignant neoplasm of breast  - MM 3D SCREENING  MAMMOGRAM BILATERAL BREAST; Future  12. Postmenopausal estrogen deficiency  - DG Bone Density; Future  13. Postmenopausal syndrome Postmenopausal state with vasomotor symptoms and sleep disturbance Hot all the time, nor a full flash, sweats and hot and red Nightime waking sweats and waking Experiencing significant vasomotor symptoms, including persistent hot sensations and difficulty cooling down, as well as sleep disturbances characterized by frequent awakenings. Symptoms likely related to postmenopausal state. Cardiovascular and breast cancer risk scores are low, indicating no major contraindications for HRT. Interested in trying HRT despite history of migraines, which are not considered a contraindication. - Order labs to assess cardiovascular risk and hormone levels. - Provide  literature on hormone replacement therapy and its risks and benefits. - Follow up in 1-2 months to assess symptom improvement and tolerance to HRT. - Refer for a sleep study to evaluate sleep disturbances.  14. Hormone replacement therapy (HRT) Discussion today -  Discussion on HRT risk/benefits, low ASCVD risk, lower than average BCA risk, no contraindications to HRT no hx of stroke, MI, VTE - will review migraines but I don't believe that is contraindication either, would need estrogen and progesterone - once labs are results and risk reassessed will send into to pt to review and consider  The 10-year ASCVD risk score (Arnett DK, et al., 2019) is: 2.1%   Values used to calculate the score:     Age: 60 years     Clincally relevant sex: Female     Is Non-Hispanic African American: No     Diabetic: No     Tobacco smoker: No     Systolic Blood Pressure: 132 mmHg     Is BP treated: No     HDL Cholesterol: 68 mg/dL     Total Cholesterol: 196 mg/dL  8.5% 5-year breast cancer risk Compared with 1.6% for the average 57 year old woman    Assessment & Plan     Additional sx pt reported/discussed  today:  Chronic back pain with muscle spasm Experiences chronic back pain with muscle spasms, particularly in the upper back and trapezius region. Managing with daily stretching and occasional use of muscle relaxers, which have been effective. - Refill muscle relaxer prescription. - Encourage continued stretching exercises.  Raynaud's phenomenon Has Raynaud's phenomenon, characterized by episodes of fingertips turning white and cold, primarily in colder conditions. Condition not significantly impacting daily life but noted as a new development.  Recording duration: 28 minutes     Michelene Cower, PA-C 09/20/23 8:35 AM

## 2023-09-21 ENCOUNTER — Ambulatory Visit: Payer: Self-pay | Admitting: Family Medicine

## 2023-09-21 LAB — CBC WITH DIFFERENTIAL/PLATELET
Absolute Lymphocytes: 2029 {cells}/uL (ref 850–3900)
Absolute Monocytes: 536 {cells}/uL (ref 200–950)
Basophils Absolute: 38 {cells}/uL (ref 0–200)
Basophils Relative: 0.6 %
Eosinophils Absolute: 50 {cells}/uL (ref 15–500)
Eosinophils Relative: 0.8 %
HCT: 39.2 % (ref 35.0–45.0)
Hemoglobin: 12.8 g/dL (ref 11.7–15.5)
MCH: 30.3 pg (ref 27.0–33.0)
MCHC: 32.7 g/dL (ref 32.0–36.0)
MCV: 92.9 fL (ref 80.0–100.0)
MPV: 10.8 fL (ref 7.5–12.5)
Monocytes Relative: 8.5 %
Neutro Abs: 3648 {cells}/uL (ref 1500–7800)
Neutrophils Relative %: 57.9 %
Platelets: 269 Thousand/uL (ref 140–400)
RBC: 4.22 Million/uL (ref 3.80–5.10)
RDW: 13 % (ref 11.0–15.0)
Total Lymphocyte: 32.2 %
WBC: 6.3 Thousand/uL (ref 3.8–10.8)

## 2023-09-21 LAB — COMPREHENSIVE METABOLIC PANEL WITH GFR
AG Ratio: 1.7 (calc) (ref 1.0–2.5)
ALT: 10 U/L (ref 6–29)
AST: 15 U/L (ref 10–35)
Albumin: 4.4 g/dL (ref 3.6–5.1)
Alkaline phosphatase (APISO): 93 U/L (ref 37–153)
BUN: 12 mg/dL (ref 7–25)
CO2: 31 mmol/L (ref 20–32)
Calcium: 9.3 mg/dL (ref 8.6–10.4)
Chloride: 103 mmol/L (ref 98–110)
Creat: 0.76 mg/dL (ref 0.50–1.03)
Globulin: 2.6 g/dL (ref 1.9–3.7)
Glucose, Bld: 89 mg/dL (ref 65–99)
Potassium: 4.7 mmol/L (ref 3.5–5.3)
Sodium: 139 mmol/L (ref 135–146)
Total Bilirubin: 0.3 mg/dL (ref 0.2–1.2)
Total Protein: 7 g/dL (ref 6.1–8.1)
eGFR: 91 mL/min/1.73m2 (ref >=60)

## 2023-09-21 LAB — LIPID PANEL
Cholesterol: 212 mg/dL — ABNORMAL HIGH (ref <200)
HDL: 69 mg/dL (ref >=50)
LDL Cholesterol (Calc): 121 mg/dL — ABNORMAL HIGH
Non-HDL Cholesterol (Calc): 143 mg/dL — ABNORMAL HIGH (ref <130)
Total CHOL/HDL Ratio: 3.1 (calc) (ref <5.0)
Triglycerides: 108 mg/dL (ref <150)

## 2023-09-21 LAB — THYROID PANEL WITH TSH
Free Thyroxine Index: 1.8 (ref 1.4–3.8)
T3 Uptake: 29 % (ref 22–35)
T4, Total: 6.1 ug/dL (ref 5.1–11.9)
TSH: 1.69 m[IU]/L (ref 0.40–4.50)

## 2023-09-21 LAB — IRON,TIBC AND FERRITIN PANEL
%SAT: 28 % (ref 16–45)
Ferritin: 23 ng/mL (ref 16–232)
Iron: 89 ng/mL (ref 45–232)
TIBC: 317 ug/dL (ref 250–450)

## 2023-09-21 LAB — VITAMIN D 25 HYDROXY (VIT D DEFICIENCY, FRACTURES): Vit D, 25-Hydroxy: 40 ng/mL (ref 30–100)

## 2023-09-21 LAB — VITAMIN B12: Vitamin B-12: 483 pg/mL (ref 200–1100)

## 2023-09-30 NOTE — Telephone Encounter (Signed)
 58 year old female with postmenopausal symptoms (brain fog, joint pain, hot flashes, palpitations, weight gain). Reviewed labs/risk scores: breast cancer risk slightly lower than average (-0.2%), ASCVD risk 2.3%, no contraindications to HRT. The 10-year ASCVD risk score (Arnett DK, et al., 2019) is: 2.3%   Values used to calculate the score:     Age: 58 years     Clincally relevant sex: Female     Is Non-Hispanic African American: No     Diabetic: No     Tobacco smoker: No     Systolic Blood Pressure: 132 mmHg     Is BP treated: No     HDL Cholesterol: 69 mg/dL     Total Cholesterol: 212 mg/dL   Discussed potential benefits (symptom relief, bone protection, possible heart health/colorectal protection) and small risks (about 1/1,000 increased breast cancer risk based on older studies, small risk of clots/other side effects).  Plan: start HRT, follow up in 1-2 months to assess tolerance, benefits, and adjust dose as needed.

## 2023-10-04 ENCOUNTER — Other Ambulatory Visit: Payer: Self-pay | Admitting: Family Medicine

## 2023-10-04 MED ORDER — ESTRADIOL-PROGESTERONE 0.5-100 MG PO CAPS: 1.0000 | ORAL_CAPSULE | Freq: Every day | ORAL | 0 refills | Status: DC

## 2023-11-09 ENCOUNTER — Emergency Department
Admission: EM | Admit: 2023-11-09 | Discharge: 2023-11-09 | Disposition: A | Discharge status: Home / Self Care | Attending: Emergency Medicine | Admitting: Emergency Medicine

## 2023-11-09 ENCOUNTER — Other Ambulatory Visit: Payer: Self-pay

## 2023-11-09 ENCOUNTER — Ambulatory Visit: Admission: EM | Admit: 2023-11-09 | Discharge: 2023-11-09 | Disposition: A | Discharge status: Home / Self Care

## 2023-11-09 DIAGNOSIS — R0989 Other specified symptoms and signs involving the circulatory and respiratory systems: Secondary | ICD-10-CM | POA: Diagnosis not present

## 2023-11-09 DIAGNOSIS — R002 Palpitations: Secondary | ICD-10-CM

## 2023-11-09 DIAGNOSIS — R457 State of emotional shock and stress, unspecified: Secondary | ICD-10-CM | POA: Diagnosis not present

## 2023-11-09 DIAGNOSIS — I10 Essential (primary) hypertension: Secondary | ICD-10-CM | POA: Diagnosis not present

## 2023-11-09 LAB — BASIC METABOLIC PANEL WITH GFR
Anion gap: 9 (ref 5–15)
BUN: 10 mg/dL (ref 6–20)
CO2: 28 mmol/L (ref 22–32)
Calcium: 8.8 mg/dL — ABNORMAL LOW (ref 8.9–10.3)
Chloride: 100 mmol/L (ref 98–111)
Creatinine, Ser: 0.64 mg/dL (ref 0.44–1.00)
GFR, Estimated: >60 mL/min (ref >60)
Glucose, Bld: 94 mg/dL (ref 70–99)
Potassium: 3.8 mmol/L (ref 3.5–5.1)
Sodium: 137 mmol/L (ref 135–145)

## 2023-11-09 LAB — CBC WITH DIFFERENTIAL/PLATELET
Abs Immature Granulocytes: 0.01 K/uL (ref 0.00–0.07)
Basophils Absolute: 0.1 K/uL (ref 0.0–0.1)
Basophils Relative: 1 %
Eosinophils Absolute: 0.1 K/uL (ref 0.0–0.5)
Eosinophils Relative: 1 %
HCT: 40.3 % (ref 36.0–46.0)
Hemoglobin: 13.3 g/dL (ref 12.0–15.0)
Immature Granulocytes: 0 %
Lymphocytes Relative: 55 %
Lymphs Abs: 4.1 K/uL — ABNORMAL HIGH (ref 0.7–4.0)
MCH: 30.3 pg (ref 26.0–34.0)
MCHC: 33 g/dL (ref 30.0–36.0)
MCV: 91.8 fL (ref 80.0–100.0)
Monocytes Absolute: 0.5 K/uL (ref 0.1–1.0)
Monocytes Relative: 7 %
Neutro Abs: 2.7 K/uL (ref 1.7–7.7)
Neutrophils Relative %: 36 %
Platelets: 329 K/uL (ref 150–400)
RBC: 4.39 MIL/uL (ref 3.87–5.11)
RDW: 13.2 % (ref 11.5–15.5)
WBC: 7.5 K/uL (ref 4.0–10.5)
nRBC: 0 % (ref 0.0–0.2)

## 2023-11-09 LAB — TROPONIN I (HIGH SENSITIVITY): Troponin I (High Sensitivity): 4 ng/L (ref <18)

## 2023-11-09 NOTE — ED Provider Notes (Signed)
 Patient presents today for palpitations beginning yesterday evening, described as heart skipping but not racing, wears Apple watch endorses heart rate has fluctuated between the 60s and 80s.  Has never occurred prior.  Thought to be related to her hydration therefore she attempted to increase fluids and took vitamins with no change in symptoms.  Symptoms becoming more prominent throughout the day.  Denies personal cardiac history.  Maternal history of MI.  Denies changes in medication.  Denies chest pain, shortness of breath, dizziness, lightheadedness or visual disturbance.  Blood pressure elevated at 173/81 on initial triage, patient very anxious during this time, unable to obtain EKG due to malfunctioning in 2 different machines, EMS called, EMS EKG showing PACs, patient transported to hospital via ambulance for further evaluation and manage   Teresa Shelba SAUNDERS, NP 11/09/23 2029

## 2023-11-09 NOTE — ED Triage Notes (Addendum)
 Patient presents to [facility] for heart palpitations since yesterday. States this evening she noted her apple watch alert her that she was in Afib. No hx of HTN or cardiac hx. Provider at bedside. EMS called.   Denies SOB.

## 2023-11-09 NOTE — ED Triage Notes (Signed)
 EMS at bedside

## 2023-11-09 NOTE — ED Notes (Signed)
 Patient is being discharged from the Urgent Care and sent to the Emergency Department via EMS . Per White, NP, patient is in need of higher level of care due to palpitations. Patient is aware and verbalizes understanding of plan of care.  Vitals:   11/09/23 2020  BP: (!) 173/81  Pulse: (!) 103  Resp: 20  SpO2: 98%

## 2023-11-09 NOTE — ED Provider Notes (Signed)
 [facility] Provider Note    Event Date/Time   First MD Initiated Contact with Patient 11/09/23 2112     (approximate)   History   Chief Complaint Palpitations   HPI  Mindy Jenkins is a 58 y.o. female with past medical history of migraines and chronic back pain who presents to the ED complaining of palpitations.  Patient reports that she has had intermittent sensation of her heart racing and skipping beats since last night.  She states that it will last for a few minutes at a time before resolving on its own, does not seem to be brought on by anything specific.  She denies any associated fevers, cough, chest pain, shortness of breath, nausea, vomiting, or diarrhea.  She denies any history of similar symptoms and denies any cardiac history.     Physical Exam   Triage Vital Signs: ED Triage Vitals  Encounter Vitals Group     BP 11/09/23 2100 (!) 156/97     Girls Systolic BP Percentile --      Girls Diastolic BP Percentile --      Boys Systolic BP Percentile --      Boys Diastolic BP Percentile --      Pulse Rate 11/09/23 2100 79     Resp --      Temp --      Temp src --      SpO2 11/09/23 2103 100 %     Weight 11/09/23 2103 170 lb (77.1 kg)     Height 11/09/23 2103 5' 4 (1.626 m)     Head Circumference --      Peak Flow --      Pain Score 11/09/23 2103 0     Pain Loc --      Pain Education --      Exclude from Growth Chart --     Most recent vital signs: Vitals:   11/09/23 2230 11/09/23 2329  BP: 132/69 136/86  Pulse: 73 64  Resp: 17 20  Temp:  98 F (36.7 C)  SpO2:  100%    Constitutional: Alert and oriented. Eyes: Conjunctivae are normal. Head: Atraumatic. Nose: No congestion/rhinnorhea. Mouth/Throat: Mucous membranes are moist.  Cardiovascular: Normal rate, regular rhythm. Grossly normal heart sounds.  2+ radial pulses bilaterally. Respiratory: Normal respiratory effort.  No retractions. Lungs CTAB. Gastrointestinal:  Soft and nontender. No distention. Musculoskeletal: No lower extremity tenderness nor edema.  Neurologic:  Normal speech and language. No gross focal neurologic deficits are appreciated.    ED Results / Procedures / Treatments   Labs (all labs ordered are listed, but only abnormal results are displayed) Labs Reviewed  CBC WITH DIFFERENTIAL/PLATELET - Abnormal; Notable for the following components:      Result Value   Lymphs Abs 4.1 (*)    All other components within normal limits  BASIC METABOLIC PANEL WITH GFR - Abnormal; Notable for the following components:   Calcium 8.8 (*)    All other components within normal limits  TROPONIN I (HIGH SENSITIVITY)  TROPONIN I (HIGH SENSITIVITY)     EKG  ED ECG REPORT I, Carlin Palin, the attending physician, personally viewed and interpreted this ECG.   Date: 11/09/2023  EKG Time: 21:05  Rate: 68  Rhythm: normal sinus rhythm  Axis: Normal  Intervals:none  ST&T Change: None  PROCEDURES:  Critical Care performed: No  Procedures   MEDICATIONS ORDERED IN ED: Medications - No data to display   IMPRESSION / MDM /  ASSESSMENT AND PLAN / ED COURSE  I reviewed the triage vital signs and the nursing notes.                              58 y.o. female with past medical history of migraines and chronic back pain who presents to the ED complaining of intermittent palpitations since last night.  Patient's presentation is most consistent with acute presentation with potential threat to life or bodily function.  Differential diagnosis includes, but is not limited to, arrhythmia, ACS, anemia, electrolyte abnormality, AKI, anxiety.  Patient nontoxic-appearing and in no acute distress, vital signs are unremarkable.  EKG without evidence of arrhythmia or ischemia and patient denies any chest pain or shortness of breath.  Labs without significant anemia, leukocytosis, electrolyte abnormality, or AKI.  Troponin within normal limits and I  doubt ACS.  No events noted on cardiac monitor and patient reports resolution of her symptoms on reassessment.  She is appropriate for discharge home with cardiology follow-up, was counseled to return to the ED for new or worsening symptoms, patient agrees with plan.      FINAL CLINICAL IMPRESSION(S) / ED DIAGNOSES   Final diagnoses:  Palpitations     Rx / DC Orders   ED Discharge Orders          Ordered    Ambulatory referral to Cardiology        11/09/23 2322             Note:  This document was prepared using Dragon voice recognition software and may include unintentional dictation errors.   Willo Dunnings, MD 11/09/23 [phone]

## 2023-11-09 NOTE — ED Triage Notes (Signed)
 Pt arrived via [facility] from [facility] with complaints of heart palpitations that started last night and resolved on their own. The patient reports that the palpitations started again tonight, so they went to urgent care. However, the urgent care's EKG machine was down, which is why they called EMS

## 2023-11-24 ENCOUNTER — Encounter: Payer: Self-pay | Admitting: Internal Medicine

## 2023-11-24 ENCOUNTER — Ambulatory Visit

## 2023-11-24 ENCOUNTER — Ambulatory Visit: Attending: Internal Medicine | Admitting: Internal Medicine

## 2023-11-24 VITALS — BP 125/70 | HR 72 | Ht 64.0 in | Wt 182.0 lb

## 2023-11-24 DIAGNOSIS — R0602 Shortness of breath: Secondary | ICD-10-CM | POA: Insufficient documentation

## 2023-11-24 DIAGNOSIS — I491 Atrial premature depolarization: Secondary | ICD-10-CM

## 2023-11-24 DIAGNOSIS — R002 Palpitations: Secondary | ICD-10-CM | POA: Insufficient documentation

## 2023-11-24 DIAGNOSIS — E7800 Pure hypercholesterolemia, unspecified: Secondary | ICD-10-CM | POA: Insufficient documentation

## 2023-11-24 NOTE — Patient Instructions (Signed)
 Medication Instructions:  Your physician recommends that you continue on your current medications as directed. Please refer to the Current Medication list given to you today.  *If you need a refill on your cardiac medications before your next appointment, please call your pharmacy*  Lab Work: No labs ordered today  If you have labs (blood work) drawn today and your tests are completely normal, you will receive your results only by: MyChart Message (if you have MyChart) OR A paper copy in the mail If you have any lab test that is abnormal or we need to change your treatment, we will call you to review the results.  Testing/Procedures: Your physician has requested that you have an echocardiogram. Echocardiography is a painless test that uses sound waves to create images of your heart. It provides your doctor with information about the size and shape of your heart and how well your heart's chambers and valves are working.   You may receive an ultrasound enhancing agent through an IV if needed to better visualize your heart during the echo. This procedure takes approximately one hour.  There are no restrictions for this procedure.  This will take place at [address] [facility]) [address], [place] [address]  Please note: We ask at that you not bring children with you during ultrasound (echo/ vascular) testing. Due to room size and safety concerns, children are not allowed in the ultrasound rooms during exams. Our front office staff cannot provide observation of children in our lobby area while testing is being conducted. An adult accompanying a patient to their appointment will only be allowed in the ultrasound room at the discretion of the ultrasound technician under special circumstances. We apologize for any inconvenience.   Follow-Up: At [facility], you and your health needs are our priority.  As part of our continuing mission to provide you with exceptional heart  care, our providers are all part of one team.  This team includes your primary Cardiologist (physician) and Advanced Practice Providers or APPs (Physician Assistants and Nurse Practitioners) who all work together to provide you with the care you need, when you need it.  Your next appointment:   6-8 week(s)  Provider:   You may see Dr End  or one of the following Advanced Practice Providers on your designated Care Team:   Lonni Meager, NP Lesley Maffucci, PA-C Bernardino Bring, PA-C Cadence [place], PA-C Tylene Lunch, NP Barnie Hila, NP    We recommend signing up for the patient portal called MyChart.  Sign up information is provided on this After Visit Summary.  MyChart is used to connect with patients for Virtual Visits (Telemedicine).  Patients are able to view lab/test results, encounter notes, upcoming appointments, etc.  Non-urgent messages can be sent to your provider as well.   To learn more about what you can do with MyChart, go to [email or web address].   Other Instructions Your physician has recommended that you wear a Zio monitor.   This monitor is a medical device that records the heart's electrical activity. Doctors most often use these monitors to diagnose arrhythmias. Arrhythmias are problems with the speed or rhythm of the heartbeat. The monitor is a small device applied to your chest. You can wear one while you do your normal daily activities. While wearing this monitor if you have any symptoms to push the button and record what you felt. Once you have worn this monitor for the period of time provider prescribed (Usually 14 days), you will  return the monitor device in the postage paid box. Once it is returned they will download the data collected and provide us  with a report which the provider will then review and we will call you with those results. Important tips:  Avoid showering during the first 24 hours of wearing the monitor. Avoid excessive sweating to help  maximize wear time. Do not submerge the device, no hot tubs, and no swimming pools. Keep any lotions or oils away from the patch. After 24 hours you may shower with the patch on. Take brief showers with your back facing the shower head.  Do not remove patch once it has been placed because that will interrupt data and decrease adhesive wear time. Push the button when you have any symptoms and write down what you were feeling. Once you have completed wearing your monitor, remove and place into box which has postage paid and place in your outgoing mailbox.  If for some reason you have misplaced your box then call our office and we can provide another box and/or mail it off for you.

## 2023-11-24 NOTE — Progress Notes (Signed)
 Cardiology Office Note:  .   Date:  11/24/2023  ID:  Cathryne JAYSON Mire, DOB 1965-07-13, MRN [id number] PCP: Leavy Mole, PA-C  [facility] Providers Cardiologist:  New     History of Present Illness: .   Discussed the use of AI scribe software for clinical note transcription with the patient, who gave verbal consent to proceed.  Korrina Zern Paster is a 58 y.o. female with history of chronic back pain, anemia, migraine headaches in remission, ADD, depression, and anxiety, who has been referred by Dr. Willo in the emergency department for evaluation of palpitations.  The patient presented to urgent care on 11/09/2023, complaining of palpitations and fluctuating heart rates.  She was hypertensive with a blood pressure of 173/81.  EKG was attempted at urgent care but could not be performed due to technical difficulties, prompting the patient to be transferred to the [facility] emergency department by EMS for further evaluation.  Workup in the ED was unrevealing.  EMS EKG was notable for sinus rhythm with isolated PACs.  Today, Ms. Berhane reports that the palpitations have recurred over the past three days, occurring regularly, especially in the mornings and evenings.  She describes the sensation as feeling like her heart is 'double beating' or skipping, with episodes lasting for hours at a time.  The palpitations are unpredictable and not associated with any specific triggers, although they can start with physical activity and sometimes decrease with relaxation techniques.  She has captured some episodes with her Apple Watch, demonstrating PACs.  Ms. Bale denies associated chest pain, significant shortness of breath, or dizziness, although she occasionally feels slightly short of breath.  She denies leg swelling, syncope, or significant chest pain.  She recalls a single episode of syncope years ago during pregnancy, attributed to orthostatic changes. She is concerned about the palpitations,  especially since they have started occurring in the morning, which is new for her.  Ms. Bahri reports that she began hormone replacement therapy for significant hot flashes as well as insomnia about 2 months ago.  It seems like the palpitations have become more pronounced since she began this medication, though she may have had sporadic episodes even before then.  She has reduced her caffeine intake to half a cup of coffee per day and is trying to stay hydrated.  She is also trying to minimize her Vyvanse use.  She denies a history of heart disease and prior cardiac testing.    ROS: See HPI  Studies Reviewed: SABRA   EKG Interpretation Date/Time:  Wednesday November 24 2023 10:20:44 EST Ventricular Rate:  72 PR Interval:  150 QRS Duration:  66 QT Interval:  366 QTC Calculation: 400 R Axis:   27  Text Interpretation: Normal sinus rhythm with sinus arrhythmia Low voltage QRS Borderline ECG When compared with ECG of 09-Nov-2023 21:05, No significant change was found Confirmed by Ortencia Askari, Lonni [phone]) on 11/24/2023 10:24:58 AM    Risk Assessment/Calculations:             Physical Exam:   VS:  BP 125/70 (BP Location: Left Arm, Patient Position: Sitting, Cuff Size: Normal)   Pulse 72 Comment: 78 oximeter  Ht 5' 4 (1.626 m)   Wt 182 lb (82.6 kg)   SpO2 98%   BMI 31.24 kg/m    Wt Readings from Last 3 Encounters:  11/24/23 182 lb (82.6 kg)  11/09/23 170 lb (77.1 kg)  09/20/23 178 lb (80.7 kg)    General:  NAD. Neck: No JVD  or HJR. Lungs: Clear to auscultation bilaterally without wheezes or crackles. Heart: Regular rate and rhythm without murmurs, rubs, or gallops. Abdomen: Soft, nontender, nondistended. Extremities: No lower extremity edema.  ASSESSMENT AND PLAN: .    Palpitations and PACs: I suspect Ms. Carmical is experiencing symptomatic PACs.  However, given that they are happening fairly frequently and cause quite a bit of anxiety, we have agreed to obtain a 14-day  event monitor to better characterize the frequency of her PACs and to exclude other more serious arrhythmias.  I have reassured Ms. Schnelle that PACs are not harmful.  I encouraged her to minimize her caffeine and Vyvanse intake, as well as to stay well-hydrated and get plenty of sleep.  Given that there is at least some temporal correlation to initiation of HRT, I encouraged her to speak with her PCP about risks and benefits of continuing this long-term.  Shortness of breath: Ms. Raj notes some shortness of breath at times, including with activity.  We will obtain an echocardiogram to exclude structural abnormalities, particularly given recent onset of more frequent palpitations and PACs.  Hyperlipidemia: Mildly elevated lipids noted on labs in September, including total cholesterol of 212 and LDL of 121.  Lifestyle modifications were encouraged.  We discussed the potential increase for ASCVD in the setting of hormone replacement therapy.  Ms. Handler will follow-up with her PCP to discuss ongoing therapy.     Dispo: Return to clinic in 6-8 weeks.  Signed, Lonni Hanson, MD

## 2023-12-16 ENCOUNTER — Other Ambulatory Visit: Payer: Self-pay | Admitting: Internal Medicine

## 2023-12-16 DIAGNOSIS — I491 Atrial premature depolarization: Secondary | ICD-10-CM

## 2023-12-16 DIAGNOSIS — E7800 Pure hypercholesterolemia, unspecified: Secondary | ICD-10-CM

## 2023-12-16 DIAGNOSIS — R002 Palpitations: Secondary | ICD-10-CM

## 2023-12-16 DIAGNOSIS — R0602 Shortness of breath: Secondary | ICD-10-CM

## 2023-12-23 ENCOUNTER — Ambulatory Visit

## 2023-12-24 DIAGNOSIS — I491 Atrial premature depolarization: Secondary | ICD-10-CM | POA: Diagnosis not present

## 2023-12-24 DIAGNOSIS — R002 Palpitations: Secondary | ICD-10-CM

## 2023-12-24 DIAGNOSIS — R0602 Shortness of breath: Secondary | ICD-10-CM | POA: Diagnosis not present

## 2023-12-27 ENCOUNTER — Ambulatory Visit: Payer: Self-pay | Admitting: Internal Medicine

## 2023-12-31 ENCOUNTER — Telehealth: Payer: Self-pay | Admitting: Internal Medicine

## 2023-12-31 ENCOUNTER — Other Ambulatory Visit: Payer: Self-pay | Admitting: Family Medicine

## 2023-12-31 MED ORDER — METOPROLOL SUCCINATE ER 25 MG PO TB24: 12.5000 mg | ORAL_TABLET | Freq: Every day | ORAL | 3 refills | Status: AC

## 2023-12-31 NOTE — Telephone Encounter (Signed)
 Called and spoke with the patient.  Patient complains of recurring palpitations and agrees to start Metoprolol  Succinate 12.5 mg once daily as discussed previously with Dr. Mady.  Prescription sent to [facility] pharmacy at [address]  Patient verbalized gratitude for the call with all questions and concerns addressed at this time.  Following is the note where adding this was addressed by Dr. Mady:  Desiderio Russell SAILOR, RN    12/27/23  3:37 PM Result Note The patient has been notified of the result along with recommendations. Pt reported she would call our office it symptoms worsened.      LONG TERM MONITOR (3-14 DAYS) End, Lonni, MD to Desiderio Russell SAILOR, RN    12/27/23  9:40 AM Result Note Please let Ms. Chovan know that her event monitor showed a few extra beats and brief episodes of elevated heart rates that could explain her palpitations.  No significant arrhythmia was identified.  If she continues to have significant palpitations that are bothersome, we could have her try taking metoprolol  succinate 12.5 mg daily.  She should follow-up with us  as planned next month to reassess her symptoms. LONG TERM MONITOR (3-14 DAYS)

## 2023-12-31 NOTE — Telephone Encounter (Signed)
 Patient c/o Palpitations:  STAT if patient reporting lightheadedness, shortness of breath, or chest pain  How long have you had palpitations/irregular HR/ Afib? Are you having the symptoms now?   Yes  Are you currently experiencing lightheadedness, SOB or CP?   No  Do you have a history of afib (atrial fibrillation) or irregular heart rhythm?   yes  Have you checked your BP or HR? (document readings if available):   No  Are you experiencing any other symptoms?    No  Patient stated she is still having palpitations and wants a prescription as discussed with Dr. Mady sent in to [id number] - [place], [place] - [address]  Patient stated her watch is showing irregular heart beats.

## 2024-01-03 NOTE — Telephone Encounter (Signed)
 Needs to schedule cpe in april

## 2024-01-03 NOTE — Telephone Encounter (Signed)
 Lvm letting pt know prescription has been sent to pharmacy and for her to return call to schedule cpe in april

## 2024-01-03 NOTE — Telephone Encounter (Signed)
 Requested medications are due for refill today.  yes  Requested medications are on the active medications list.  yes  Last refill. 10/04/2023 #90 0 rf  Future visit scheduled.   no  Notes to clinic.  Michelene Cower signed Rx. Med not assigned to a protocol. Please review for refill.    Requested Prescriptions  Pending Prescriptions Disp Refills   BIJUVA  0.5-100 MG CAPS [Pharmacy Med Name: BIJUVA  0.5MG /100MG  CAPSULES] 90 capsule 0    Sig: TAKE 1 CAPSULE BY MOUTH DAILY     Off-Protocol Failed - 01/03/2024  3:46 PM      Failed - Medication not assigned to a protocol, review manually.      Passed - Valid encounter within last 12 months    Recent Outpatient Visits           3 months ago Other fatigue   [facility] Cower Michelene, PA-C       Future Appointments             In 2 weeks End, Lonni, MD [facility]

## 2024-01-19 ENCOUNTER — Ambulatory Visit: Admitting: Internal Medicine

## 2024-01-20 ENCOUNTER — Ambulatory Visit
